# Patient Record
Sex: Female | Born: 1965
Health system: Southern US, Community
[De-identification: ages and names within clinical notes are randomized; demographics above are authoritative.]

## PROBLEM LIST (undated history)

## (undated) DIAGNOSIS — T7840XA Allergy, unspecified, initial encounter: Secondary | ICD-10-CM

## (undated) HISTORY — DX: Allergy, unspecified, initial encounter: T78.40XA

## (undated) HISTORY — PX: OTHER SURGICAL HISTORY: SHX169

---

## 1998-10-23 ENCOUNTER — Ambulatory Visit (HOSPITAL_COMMUNITY): Admission: RE | Admit: 1998-10-23 | Discharge: 1998-10-23 | Payer: Self-pay | Admitting: Obstetrics and Gynecology

## 1998-11-07 ENCOUNTER — Encounter: Admission: RE | Admit: 1998-11-07 | Discharge: 1999-02-05 | Payer: Self-pay | Admitting: Obstetrics and Gynecology

## 1999-01-09 ENCOUNTER — Inpatient Hospital Stay (HOSPITAL_COMMUNITY): Admission: AD | Admit: 1999-01-09 | Discharge: 1999-01-12 | Payer: Self-pay | Admitting: Obstetrics and Gynecology

## 1999-01-11 ENCOUNTER — Encounter (HOSPITAL_COMMUNITY): Admission: RE | Admit: 1999-01-11 | Discharge: 1999-04-11 | Payer: Self-pay | Admitting: Obstetrics and Gynecology

## 1999-05-27 ENCOUNTER — Other Ambulatory Visit: Admission: RE | Admit: 1999-05-27 | Discharge: 1999-05-27 | Payer: Self-pay | Admitting: Obstetrics and Gynecology

## 2000-06-07 ENCOUNTER — Other Ambulatory Visit: Admission: RE | Admit: 2000-06-07 | Discharge: 2000-06-07 | Payer: Self-pay | Admitting: Obstetrics and Gynecology

## 2001-06-02 ENCOUNTER — Other Ambulatory Visit: Admission: RE | Admit: 2001-06-02 | Discharge: 2001-06-02 | Payer: Self-pay | Admitting: Obstetrics and Gynecology

## 2001-08-01 ENCOUNTER — Ambulatory Visit (HOSPITAL_COMMUNITY): Admission: RE | Admit: 2001-08-01 | Discharge: 2001-08-01 | Payer: Self-pay | Admitting: Obstetrics and Gynecology

## 2001-11-03 ENCOUNTER — Ambulatory Visit (HOSPITAL_COMMUNITY): Admission: RE | Admit: 2001-11-03 | Discharge: 2001-11-03 | Payer: Self-pay | Admitting: Obstetrics and Gynecology

## 2002-01-05 ENCOUNTER — Encounter: Payer: Self-pay | Admitting: Obstetrics and Gynecology

## 2002-01-05 ENCOUNTER — Inpatient Hospital Stay (HOSPITAL_COMMUNITY): Admission: AD | Admit: 2002-01-05 | Discharge: 2002-01-09 | Payer: Self-pay | Admitting: Obstetrics and Gynecology

## 2002-01-10 ENCOUNTER — Encounter: Admission: RE | Admit: 2002-01-10 | Discharge: 2002-02-09 | Payer: Self-pay | Admitting: Obstetrics and Gynecology

## 2002-02-10 ENCOUNTER — Encounter: Admission: RE | Admit: 2002-02-10 | Discharge: 2002-03-12 | Payer: Self-pay | Admitting: Obstetrics and Gynecology

## 2002-02-13 ENCOUNTER — Other Ambulatory Visit: Admission: RE | Admit: 2002-02-13 | Discharge: 2002-02-13 | Payer: Self-pay | Admitting: Obstetrics and Gynecology

## 2002-10-18 HISTORY — PX: NASAL SINUS SURGERY: SHX719

## 2003-02-22 ENCOUNTER — Other Ambulatory Visit: Admission: RE | Admit: 2003-02-22 | Discharge: 2003-02-22 | Payer: Self-pay | Admitting: Obstetrics and Gynecology

## 2003-09-18 ENCOUNTER — Ambulatory Visit (HOSPITAL_COMMUNITY): Admission: RE | Admit: 2003-09-18 | Discharge: 2003-09-18 | Payer: Self-pay | Admitting: Internal Medicine

## 2004-03-20 ENCOUNTER — Other Ambulatory Visit: Admission: RE | Admit: 2004-03-20 | Discharge: 2004-03-20 | Payer: Self-pay | Admitting: Obstetrics and Gynecology

## 2004-12-25 ENCOUNTER — Encounter (INDEPENDENT_AMBULATORY_CARE_PROVIDER_SITE_OTHER): Payer: Self-pay | Admitting: *Deleted

## 2004-12-25 ENCOUNTER — Ambulatory Visit (HOSPITAL_COMMUNITY): Admission: RE | Admit: 2004-12-25 | Discharge: 2004-12-25 | Payer: Self-pay | Admitting: Obstetrics and Gynecology

## 2005-04-27 ENCOUNTER — Other Ambulatory Visit: Admission: RE | Admit: 2005-04-27 | Discharge: 2005-04-27 | Payer: Self-pay | Admitting: Obstetrics and Gynecology

## 2007-06-22 ENCOUNTER — Encounter: Admission: RE | Admit: 2007-06-22 | Discharge: 2007-06-22 | Payer: Self-pay | Admitting: Obstetrics and Gynecology

## 2010-01-16 ENCOUNTER — Encounter (INDEPENDENT_AMBULATORY_CARE_PROVIDER_SITE_OTHER): Payer: Self-pay | Admitting: Obstetrics and Gynecology

## 2010-01-16 ENCOUNTER — Ambulatory Visit (HOSPITAL_COMMUNITY): Admission: RE | Admit: 2010-01-16 | Discharge: 2010-01-17 | Payer: Self-pay | Admitting: Obstetrics and Gynecology

## 2010-04-10 ENCOUNTER — Encounter
Admission: RE | Admit: 2010-04-10 | Discharge: 2010-04-10 | Payer: Self-pay | Source: Home / Self Care | Admitting: Obstetrics and Gynecology

## 2010-10-06 ENCOUNTER — Ambulatory Visit (HOSPITAL_COMMUNITY)
Admission: RE | Admit: 2010-10-06 | Discharge: 2010-10-06 | Payer: Self-pay | Source: Home / Self Care | Attending: Family Medicine | Admitting: Family Medicine

## 2010-10-23 ENCOUNTER — Encounter (HOSPITAL_COMMUNITY)
Admission: RE | Admit: 2010-10-23 | Discharge: 2010-11-17 | Payer: Self-pay | Source: Home / Self Care | Attending: Neurology | Admitting: Neurology

## 2010-11-19 ENCOUNTER — Encounter (HOSPITAL_COMMUNITY)
Admission: RE | Admit: 2010-11-19 | Discharge: 2010-11-19 | Disposition: A | Discharge status: Home / Self Care | Payer: Self-pay | Source: Ambulatory Visit | Attending: Neurology | Admitting: Neurology

## 2010-11-19 DIAGNOSIS — M545 Low back pain, unspecified: Secondary | ICD-10-CM | POA: Insufficient documentation

## 2010-11-19 DIAGNOSIS — IMO0001 Reserved for inherently not codable concepts without codable children: Secondary | ICD-10-CM | POA: Insufficient documentation

## 2011-01-06 LAB — BASIC METABOLIC PANEL
BUN: 4 mg/dL — ABNORMAL LOW (ref 6–23)
CO2: 31 mEq/L (ref 19–32)
Calcium: 8.4 mg/dL (ref 8.4–10.5)
Chloride: 101 mEq/L (ref 96–112)
Creatinine, Ser: 0.67 mg/dL (ref 0.4–1.2)
GFR calc Af Amer: >60 mL/min (ref >60)
GFR calc non Af Amer: >60 mL/min (ref >60)
Glucose, Bld: 119 mg/dL — ABNORMAL HIGH (ref 70–99)
Potassium: 3.5 mEq/L (ref 3.5–5.1)
Sodium: 136 mEq/L (ref 135–145)

## 2011-01-06 LAB — CBC
HCT: 33.6 % — ABNORMAL LOW (ref 36.0–46.0)
Hemoglobin: 11.6 g/dL — ABNORMAL LOW (ref 12.0–15.0)
MCHC: 34.5 g/dL (ref 30.0–36.0)
MCV: 99.6 fL (ref 78.0–100.0)
Platelets: 202 10*3/uL (ref 150–400)
RBC: 3.37 MIL/uL — ABNORMAL LOW (ref 3.87–5.11)
RDW: 12.1 % (ref 11.5–15.5)
WBC: 9.3 10*3/uL (ref 4.0–10.5)

## 2011-01-10 LAB — BASIC METABOLIC PANEL
BUN: 12 mg/dL (ref 6–23)
Calcium: 8.9 mg/dL (ref 8.4–10.5)
Chloride: 104 mEq/L (ref 96–112)
Creatinine, Ser: 0.7 mg/dL (ref 0.4–1.2)
GFR calc Af Amer: >60 mL/min (ref >60)

## 2011-01-10 LAB — CBC
MCV: 99.2 fL (ref 78.0–100.0)
Platelets: 245 10*3/uL (ref 150–400)
RBC: 4.21 MIL/uL (ref 3.87–5.11)
WBC: 7.9 10*3/uL (ref 4.0–10.5)

## 2011-01-10 LAB — PREGNANCY, URINE: Preg Test, Ur: NEGATIVE

## 2011-03-05 NOTE — Op Note (Signed)
Darlene Galvan, Darlene Galvan                ACCOUNT NO.:  [id number]   MEDICAL RECORD NO.:  [id number]          PATIENT TYPE:  AMB   LOCATION:  SDC                           FACILITY:  [facility]   PHYSICIAN:  Michelle L. Grewal, M.D.DATE OF BIRTH:  [birth year]   DATE OF PROCEDURE:  12/25/2004  DATE OF DISCHARGE:                                 OPERATIVE REPORT   PREOPERATIVE DIAGNOSES:  1.  Retained intrauterine device.  2.  Endometritis.   POSTOPERATIVE DIAGNOSES:  1.  Retained intrauterine device.  2.  Endometritis.   PROCEDURES:  1.  Dilatation and curettage.  2.  Intrauterine device removal.   SURGEON:  Michelle L. Vincente Poli, M.D.   ASSISTANT:  Not applicable.   ANESTHESIA:  MAC with local.   SPECIMENS:  Uterine curettings and IUD.   ESTIMATED BLOOD LOSS:  Minimal.   COMPLICATIONS:  None.   PROCEDURE:  The patient was taken to the operating room and sedation was  performed.  She was placed in the lithotomy position.  She was prepped and  draped in the usual sterile fashion and an in-and-out catheter was used to  empty the bladder.  A speculum was inserted in the vagina.  The cervix was  grasped with a tenaculum, and a paracervical block was performed.  Using the  IUD hook, we inserted it into the uterus and removed the IUD, which was  intact.  It was noted the sling was slightly wrapped around the IUD.  At  this point I then inserted a sharp curette and curetted the uterus, and the  blood that came out was slightly watery and murky.  I suspect the patient  had endometritis.  At the end of the procedure, no bleeding was noted.  All  sponge, lap and instrument counts were correct x2.  She went to the recovery  room in stable condition.    MLG/MEDQ  D:  12/25/2004  T:  12/25/2004  Job:  [id number]

## 2011-03-05 NOTE — H&P (Signed)
NAME:  Darlene Galvan, Darlene Galvan NO.:  [id number]   MEDICAL RECORD NO.:  [id number]                  PATIENT TYPE:   LOCATION:                                       FACILITY:   PHYSICIAN:  Lionel December, M.D.                 DATE OF BIRTH:  03-24-1966   DATE OF ADMISSION:  DATE OF DISCHARGE:                                HISTORY & PHYSICAL   REFERRAL:  Self.   CHIEF COMPLAINT:  Hoarseness.   HISTORY OF PRESENT ILLNESS:  Darlene Galvan is a 45 year old Caucasian female who  presents today as a self-referral for further evaluation of persistent  chronic hoarseness.  She had sinus surgery in May 2004 by Dr. Annalee Genta for  chronic sinusitis and deviated septum.  She states a couple of months later  she started developing hoarseness.  She was evaluated by Dr. Annalee Genta again  and was told that her symptoms were due to gastroesophageal reflux disease.  She was started on Nexium once daily and took this for two months.  The  symptoms gradually worsened.  Then she was switched to Protonix 40 mg b.i.d.  which she has been on since that time.  The patient is not improving.  She  denies any typical heartburn symptoms now or even before being on PPI  therapy.  Denies any dysphagia, odynophagia.  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  Since being on the Protonix  she has noted some abdominal cramping and bloating and constipation.  Her  appetite is normal.  Bowels move every two days.  Denies any melena or  rectal bleeding.   CURRENT MEDICATIONS:  1. Protonix 40 mg b.i.d.  2. Tylenol Sinus p.r.n.  3. Aleve started this week for back pain - takes two daily p.r.n.  4. Excedrin p.r.n. headache - takes one dose every two to three weeks.  5. Allegra daily.  6. B12 daily.  7. Multivitamin daily.   ALLERGIES:  CODEINE and SULFA.   PAST MEDICAL HISTORY:  Early osteoarthritis.   PAST SURGICAL HISTORY:  Status post sinus surgery for chronic  sinusitis and  deviated septum by Dr. Annalee Genta Mar 18, 2003.  She had right knee surgery  in 1995.  She has had two cesarean sections.   FAMILY HISTORY:  Mother has hemophilia.  Father has heart problems and  hypertension.  No family history of colorectal cancer.   SOCIAL HISTORY:  She has been married for six years, has two children.  She  is employed at [employer].  She has never been a smoker.  She may consume a glass of wine on the weekend.   REVIEW OF SYSTEMS:  Please see HPI for GI.  CARDIOPULMONARY:  Denies any  shortness of breath.  GENERAL:  Denies any weight loss.   PHYSICAL EXAMINATION:  VITAL SIGNS:  Weight 131, height  5 feet 3.5 inches.  Temperature 96.8, blood pressure 110/70, pulse 68.  GENERAL:  Pleasant, well-nourished, well-developed Caucasian female in no  acute distress.  SKIN:  Warm and dry, no jaundice.  HEENT:  Pupils equal, round, and reactive to light.  Conjunctivae are pink,  sclerae nonicteric.  Oropharyngeal mucosa moist and pink; no lesions,  erythema, or exudate.  No lymphadenopathy or thyromegaly.  CHEST:  Lungs are clear to auscultation.  CARDIAC:  Reveals regular rate and rhythm, normal S1, S2.  No murmurs, rubs,  or gallops.  ABDOMEN:  Positive bowel sounds, soft, nontender, nondistended.  No  organomegaly or masses.  EXTREMITIES:  No edema.   IMPRESSION:  Chronic persistent hoarseness, possibly due to  laryngopharyngeal reflux.  She has now been on PPI therapy for at least four  months and notes no improvement of her symptoms.   PLAN:  1. Upper endoscopy in the near future.  She ultimately may need to have a 24-     hour pH probe study off of therapy.  2. She will stop Protonix, begin Aciphex 20 mg p.o. b.i.d. #30 provided.  3. Will try to obtain records from Dr. Annalee Genta.     _____________________________________  ___________________________________________  Tana Coast, P.A.                      Lionel December,  M.D.   LL/MEDQ  D:  09/06/2003  T:  09/06/2003  Job:  [id number]

## 2011-03-05 NOTE — Discharge Summary (Signed)
[facility]  Patient:    Darlene Galvan, Darlene Galvan Visit Number: [id number] MRN: [id number]          Service Type: OBS Location: 910A 9101 [id number] Attending Physician:  Soledad Gerlach Dictated by:   Juluis Mire, M.D. Admit Date:  01/05/2002 Discharge Date: 01/09/2002                             Discharge Summary  ADMITTING DIAGNOSES:          1. Intrauterine pregnancy at 36-5/7 weeks with                                  rupture of membranes.                               2. Prior cesarean section, desires a trial of                                  labor.  DISCHARGE DIAGNOSES:          1. Intrauterine pregnancy at 36-5/7 weeks with                                  rupture of membranes.                               2. Prior cesarean section, desires a trial of                                  labor.                               3. Subsequent failure to progress, leading to a                                  low transverse cesarean section.  HISTORY OF PRESENT ILLNESS:   For complete history and physical, please see written note.  HOSPITAL COURSE:              The patient was admitted on the 21st with spontaneous rupture of membranes.  Pooled amniotic fluid was sent with an L/S or 3:1 with positive PG.  She was begun on Pitocin induction.  Failed to progress and subsequently had a low transverse cesarean section on March 22 with delivery of a female infant, Apgars were 8/8.  The pH was 7.32.  Postop did well, postop hemoglobin 10.2, discharged home on her third postop day.  At that time she was afebrile with stable vital signs.  Fundus was firm and nontender.  The incision was intact.  Staples were being removed.  She had normal bowel and bladder function.  In terms of complications, none were encountered during her stay in the hospital.  The patient was discharged in stable condition.  DISPOSITION:                  Routine postop  instructions were given.  She is to avoid heavy lifting, vaginal entrance, or driving a car.  DISCHARGE MEDICATIONS:        Discharged home on Tylox as needed for pain and to continue prenatal vitamins.  FOLLOW-UP:                    She will follow up in the office in one week for incisional check.  SPECIAL INSTRUCTIONS:         She is to watch for signs of infection, heavy bleeding, or increasing discomfort. Dictated by:   Juluis Mire, M.D. Attending Physician:  Soledad Gerlach DD:  01/09/02 TD:  01/10/02 Job: [id number]

## 2012-04-27 ENCOUNTER — Other Ambulatory Visit: Payer: Self-pay | Admitting: Obstetrics and Gynecology

## 2013-03-28 ENCOUNTER — Ambulatory Visit (INDEPENDENT_AMBULATORY_CARE_PROVIDER_SITE_OTHER): Payer: BC Managed Care – PPO | Admitting: Family Medicine

## 2013-03-28 ENCOUNTER — Encounter: Payer: Self-pay | Admitting: Family Medicine

## 2013-03-28 VITALS — BP 122/70 | Temp 98.2°F | Wt 135.6 lb

## 2013-03-28 DIAGNOSIS — J019 Acute sinusitis, unspecified: Secondary | ICD-10-CM

## 2013-03-28 MED ORDER — LEVOFLOXACIN 500 MG PO TABS: 500.0000 mg | ORAL_TABLET | Freq: Every day | ORAL | Status: AC

## 2013-03-28 NOTE — Progress Notes (Signed)
  Subjective:    Patient ID: Darlene Galvan, female    DOB: Feb 01, 1966, 46 y.o.   MRN: [id number]  Sore Throat  This is a new problem. The current episode started in the past 7 days. Associated symptoms include congestion and ear pain.  Patient with head congestion drainage sinus pressure not feeling good sore throat denies high fever chills denies any wheezing. Past medical history sinusitis social doesn't smoke family history noncontributory    Review of Systems  HENT: Positive for ear pain and congestion.        Objective:   Physical Exam Eardrums normal throat normal neck is supple lungs clear heart regular no crackles not respiratory distress moderate sinus tenderness and pain       Assessment & Plan:  Acute sinusitis-Levaquin 10 days minimum patient has frequent history of this may need 14 days. She is to followup if progressive troubles warnings were discussed

## 2013-04-05 ENCOUNTER — Other Ambulatory Visit: Payer: Self-pay | Admitting: Family Medicine

## 2013-06-25 ENCOUNTER — Encounter: Payer: Self-pay | Admitting: Family Medicine

## 2013-06-25 ENCOUNTER — Ambulatory Visit (INDEPENDENT_AMBULATORY_CARE_PROVIDER_SITE_OTHER): Payer: BC Managed Care – PPO | Admitting: Family Medicine

## 2013-06-25 VITALS — BP 106/68 | Ht 63.0 in | Wt 140.6 lb

## 2013-06-25 DIAGNOSIS — M79605 Pain in left leg: Secondary | ICD-10-CM | POA: Insufficient documentation

## 2013-06-25 DIAGNOSIS — M545 Low back pain, unspecified: Secondary | ICD-10-CM

## 2013-06-25 MED ORDER — CYCLOBENZAPRINE HCL 10 MG PO TABS: 10.0000 mg | ORAL_TABLET | Freq: Three times a day (TID) | ORAL | Status: DC | PRN

## 2013-06-25 MED ORDER — KETOCONAZOLE 2 % EX CREA: TOPICAL_CREAM | Freq: Two times a day (BID) | CUTANEOUS | Status: DC

## 2013-06-25 MED ORDER — PREDNISONE 20 MG PO TABS: ORAL_TABLET | ORAL | Status: DC

## 2013-06-25 MED ORDER — OXYCODONE-ACETAMINOPHEN 5-325 MG PO TABS: 1.0000 | ORAL_TABLET | ORAL | Status: DC | PRN

## 2013-06-25 NOTE — Progress Notes (Signed)
  Subjective:    Patient ID: Darlene Galvan, female    DOB: 17-Jul-1966, 47 y.o.   MRN: [id number]  Back Pain This is a new problem. The current episode started in the past 7 days. The problem occurs constantly. The problem has been rapidly worsening since onset. The pain is present in the lumbar spine. The quality of the pain is described as burning. The pain radiates to the left thigh, left knee and left foot. The pain is at a severity of 8/10. The pain is moderate. The pain is the same all the time. Stiffness is present all day.   Patient arrives with severe back pain -started Friday and is getting worse. Pain is shooting down left leg. She's had a sacroiliac strain in the past.  Review of Systems  Musculoskeletal: Positive for back pain.       Objective:   Physical Exam  Lower back moderate tenderness in the lower left leg negative straight leg raise. Increased pain with walking. Numbness on the lateral portion of the leg. No foot drop. Strength overall decent.      Assessment & Plan:  FMLA Percocet/ flexiril/ prednisone 10 day followup Patient was told that if the pain becomes worse or wakes her up at night or weakness or loss of bowel or bladder control immediately notify us may need MRI may need neurosurgeon opinion followup 10 days

## 2013-06-30 ENCOUNTER — Encounter: Payer: Self-pay | Admitting: *Deleted

## 2013-07-05 ENCOUNTER — Ambulatory Visit: Payer: BC Managed Care – PPO | Admitting: Family Medicine

## 2013-08-03 ENCOUNTER — Telehealth: Payer: Self-pay

## 2013-08-03 ENCOUNTER — Other Ambulatory Visit: Payer: Self-pay | Admitting: *Deleted

## 2013-08-03 MED ORDER — MELOXICAM 15 MG PO TABS: 15.0000 mg | ORAL_TABLET | Freq: Every day | ORAL | Status: DC

## 2013-08-03 NOTE — Telephone Encounter (Signed)
Please inform the patient that I received a letter from Dr. Magnus Sinning a chiropractor. He had requested that we prescribe the medication for pain and inflammation. I would recommend Mobic 15 mg 1 daily #30 with 2 refills. This is a strong anti-inflammatory that should help with pain and lower back inflammation. If it bothers her stomach she should stop it. Do not take ibuprofen or Aleve this with this. If she has further troubles please followup with [place].

## 2013-08-03 NOTE — Telephone Encounter (Signed)
See letter on chart

## 2013-08-03 NOTE — Telephone Encounter (Signed)
Discussed with pt. Med sent to [facility].

## 2013-09-04 ENCOUNTER — Other Ambulatory Visit: Payer: Self-pay | Admitting: Family Medicine

## 2013-09-19 ENCOUNTER — Other Ambulatory Visit: Payer: Self-pay | Admitting: Obstetrics and Gynecology

## 2013-12-03 ENCOUNTER — Other Ambulatory Visit: Payer: Self-pay | Admitting: Family Medicine

## 2013-12-19 ENCOUNTER — Encounter: Payer: Self-pay | Admitting: Family Medicine

## 2013-12-19 ENCOUNTER — Ambulatory Visit (INDEPENDENT_AMBULATORY_CARE_PROVIDER_SITE_OTHER): Payer: BC Managed Care – PPO | Admitting: Family Medicine

## 2013-12-19 VITALS — BP 114/68 | Temp 99.1°F | Ht 63.0 in | Wt 146.0 lb

## 2013-12-19 DIAGNOSIS — J322 Chronic ethmoidal sinusitis: Secondary | ICD-10-CM

## 2013-12-19 MED ORDER — METHYLPREDNISOLONE ACETATE 40 MG/ML IJ SUSP
40.0000 mg | Freq: Once | INTRAMUSCULAR | Status: AC
  Administered 2013-12-19: 40 mg via INTRAMUSCULAR

## 2013-12-19 MED ORDER — LEVOFLOXACIN 500 MG PO TABS: 500.0000 mg | ORAL_TABLET | Freq: Every day | ORAL | Status: DC

## 2013-12-19 NOTE — Progress Notes (Signed)
   Subjective:    Patient ID: Darlene Galvan, female    DOB: 11/12/1965, 48 y.o.   MRN: [id number]  Fever  This is a new problem. The current episode started 1 to 4 weeks ago. Associated symptoms include coughing and headaches. Associated symptoms comments: Runny nose. Treatments tried: augmentin. The treatment provided no relief.   Frontal headache  Sig congestion   Cough with occas production  dimihed energya and low gr temp  achey and cough.   Review of Systems  Constitutional: Positive for fever.  Respiratory: Positive for cough.   Neurological: Positive for headaches.   no vomiting no diarrhea no rash     Objective:   Physical Exam Alert moderate malaise. HEENT moderate nasal congestion pharynx normal. Neck supple. Frontal tenderness. Lungs clear. Heart regular in rhythm.       Assessment & Plan:  Impression 1 acute rhinosinusitis discussed plan antibiotics prescribed. Symptomatic care discussed. Warning signs discussed. WSL

## 2014-06-13 ENCOUNTER — Encounter: Payer: Self-pay | Admitting: Nurse Practitioner

## 2014-06-13 ENCOUNTER — Ambulatory Visit (INDEPENDENT_AMBULATORY_CARE_PROVIDER_SITE_OTHER): Payer: BC Managed Care – PPO | Admitting: Nurse Practitioner

## 2014-06-13 VITALS — BP 112/70 | Ht 63.0 in | Wt 145.0 lb

## 2014-06-13 DIAGNOSIS — J329 Chronic sinusitis, unspecified: Secondary | ICD-10-CM

## 2014-06-13 DIAGNOSIS — J310 Chronic rhinitis: Secondary | ICD-10-CM

## 2014-06-13 MED ORDER — METHYLPREDNISOLONE ACETATE 40 MG/ML IJ SUSP
40.0000 mg | Freq: Once | INTRAMUSCULAR | Status: AC
  Administered 2014-06-13: 40 mg via INTRAMUSCULAR

## 2014-06-13 MED ORDER — LEVOFLOXACIN 500 MG PO TABS: 500.0000 mg | ORAL_TABLET | Freq: Every day | ORAL | Status: DC

## 2014-06-19 ENCOUNTER — Encounter: Payer: Self-pay | Admitting: Nurse Practitioner

## 2014-06-19 NOTE — Progress Notes (Signed)
Subjective:  Presents with a sinus symptoms for the past 4 days, worse yesterday. Facial area headache. Occasional cough. Producing green mucus. Postnasal drainage. Sore throat. Ear pressure. Hoarseness. No fever. No wheezing. No relief with OTC meds.  Objective:   BP 112/70  Ht  (1.6 m)  Wt 145 lb (65.772 kg)  BMI 25.69 kg/m2 NAD. Alert, oriented. TMs clear effusion, no erythema. Pharynx injected with green PND noted. Neck supple with mild soft anterior adenopathy. Lungs clear. Heart regular rate rhythm.  Assessment: Rhinosinusitis - Plan: methylPREDNISolone acetate (DEPO-MEDROL) injection 40 mg  Plan:  Meds ordered this encounter  Medications  . levofloxacin (LEVAQUIN) 500 MG tablet    Sig: Take 1 tablet (500 mg total) by mouth daily.    Dispense:  10 tablet    Refill:  0    Order Specific Question:  Supervising Provider    Answer:  Merlyn Albert [[id number]]  . methylPREDNISolone acetate (DEPO-MEDROL) injection 40 mg    Sig:    OTC meds as directed for congestion and cough. Restart Flonase as directed. Callback in 7-10 days if no improvement, sooner if worse.

## 2014-06-20 ENCOUNTER — Other Ambulatory Visit: Payer: Self-pay | Admitting: Family Medicine

## 2014-06-25 ENCOUNTER — Other Ambulatory Visit: Payer: Self-pay | Admitting: Nurse Practitioner

## 2014-07-03 ENCOUNTER — Telehealth: Payer: Self-pay | Admitting: Family Medicine

## 2014-07-03 MED ORDER — LEVOFLOXACIN 500 MG PO TABS: 500.0000 mg | ORAL_TABLET | Freq: Every day | ORAL | Status: DC

## 2014-07-03 NOTE — Telephone Encounter (Signed)
It would be fine to go ahead and refill the Levaquin. Followup of ongoing troubles.

## 2014-07-03 NOTE — Telephone Encounter (Signed)
Rx sent electronically to pharmacy. Patient notified. 

## 2014-07-03 NOTE — Telephone Encounter (Signed)
Pt seen for sinus infection 06/13/14, given levaquin Is still not any better, lots of pressure in her frontal  Lobe, pressure on her teeth, etc   Wants to know if we can refill this med for her?   She got a little better but it came back after finishing the med    [address]

## 2014-10-14 ENCOUNTER — Ambulatory Visit (INDEPENDENT_AMBULATORY_CARE_PROVIDER_SITE_OTHER): Payer: BC Managed Care – PPO | Admitting: Family Medicine

## 2014-10-14 ENCOUNTER — Encounter: Payer: Self-pay | Admitting: Family Medicine

## 2014-10-14 VITALS — BP 102/64 | Temp 98.1°F | Ht 63.0 in | Wt 140.0 lb

## 2014-10-14 DIAGNOSIS — J301 Allergic rhinitis due to pollen: Secondary | ICD-10-CM

## 2014-10-14 DIAGNOSIS — J0100 Acute maxillary sinusitis, unspecified: Secondary | ICD-10-CM

## 2014-10-14 MED ORDER — LEVOFLOXACIN 500 MG PO TABS: 500.0000 mg | ORAL_TABLET | Freq: Every day | ORAL | Status: DC

## 2014-10-14 MED ORDER — METHYLPREDNISOLONE ACETATE 40 MG/ML IJ SUSP
40.0000 mg | Freq: Once | INTRAMUSCULAR | Status: AC
  Administered 2014-10-14: 40 mg via INTRAMUSCULAR

## 2014-10-14 NOTE — Progress Notes (Signed)
   Subjective:    Patient ID: Darlene Galvan, female    DOB: 05/09/1966, 48 y.o.   MRN: [id number]  Sinusitis This is a new problem. The current episode started in the past 7 days. Associated symptoms include congestion, coughing, headaches, a hoarse voice, sinus pressure and a sore throat. Pertinent negatives include no ear pain or shortness of breath. Treatments tried: triaminic, mucinex, tylenol sinus.   Has had significant allergy issues sneezing congestion over the past few weeks. Requests steroid shot. PMH benign  Review of Systems  Constitutional: Negative for fever and activity change.  HENT: Positive for congestion, hoarse voice, rhinorrhea, sinus pressure and sore throat. Negative for ear pain.   Eyes: Negative for discharge.  Respiratory: Positive for cough. Negative for shortness of breath and wheezing.   Cardiovascular: Negative for chest pain.  Neurological: Positive for headaches.       Objective:   Physical Exam  Constitutional: She appears well-developed.  HENT:  Head: Normocephalic.  Nose: Nose normal.  Mouth/Throat: Oropharynx is clear and moist. No oropharyngeal exudate.  Neck: Neck supple.  Cardiovascular: Normal rate and normal heart sounds.   No murmur heard. Pulmonary/Chest: Effort normal and breath sounds normal. She has no wheezes.  Lymphadenopathy:    She has no cervical adenopathy.  Skin: Skin is warm and dry.  Nursing note and vitals reviewed.         Assessment & Plan:  Viral syndrome with secondary sinusitis antibiotics prescribed warning signs discussed should gradually get better.

## 2014-11-04 ENCOUNTER — Other Ambulatory Visit: Payer: Self-pay | Admitting: Family Medicine

## 2014-11-14 ENCOUNTER — Other Ambulatory Visit: Payer: Self-pay | Admitting: Obstetrics and Gynecology

## 2014-11-15 LAB — CYTOLOGY - PAP

## 2014-11-22 ENCOUNTER — Encounter: Payer: Self-pay | Admitting: Family Medicine

## 2014-11-22 ENCOUNTER — Ambulatory Visit (INDEPENDENT_AMBULATORY_CARE_PROVIDER_SITE_OTHER): Payer: BC Managed Care – PPO | Admitting: Family Medicine

## 2014-11-22 VITALS — BP 110/80 | Temp 98.4°F | Ht 63.0 in | Wt 143.2 lb

## 2014-11-22 DIAGNOSIS — J0100 Acute maxillary sinusitis, unspecified: Secondary | ICD-10-CM

## 2014-11-22 MED ORDER — LEVOFLOXACIN 500 MG PO TABS: 500.0000 mg | ORAL_TABLET | Freq: Every day | ORAL | Status: DC

## 2014-11-22 NOTE — Progress Notes (Signed)
   Subjective:    Patient ID: Darlene Galvan, female    DOB: 08/25/1966, 49 y.o.   MRN: [id number]  Sinusitis This is a new problem. The current episode started in the past 7 days. The problem is unchanged. Associated symptoms include congestion, ear pain, headaches and sinus pressure. Past treatments include oral decongestants. The treatment provided no relief.   Patient states that she has no other concerns at this time.    Review of Systems  HENT: Positive for congestion, ear pain and sinus pressure.   Neurological: Positive for headaches.   Head congestion drainage coughing sinus pressure not feeling good denies high fever    Objective:   Physical Exam Moderate sinus tenderness frontal and maxillary eardrums normal throat is normal neck supple lungs clear heart regular patient not toxic       Assessment & Plan:  Sinusitis antibodies prescribe warning signs discussed follow-up if problems  If frequent problems with sinuses consider ENT referral patient was talked to about that she will let us know

## 2014-12-03 ENCOUNTER — Telehealth: Payer: Self-pay | Admitting: Family Medicine

## 2014-12-03 ENCOUNTER — Other Ambulatory Visit: Payer: Self-pay | Admitting: *Deleted

## 2014-12-03 MED ORDER — LEVOFLOXACIN 500 MG PO TABS: 500.0000 mg | ORAL_TABLET | Freq: Every day | ORAL | Status: DC

## 2014-12-03 NOTE — Telephone Encounter (Signed)
Patient was seen 2/5 with sinus infection and finished up antibotics Sunday but sinues are still infected and heavy drainage. She wants another around of antibotics called into [facility].

## 2014-12-03 NOTE — Telephone Encounter (Signed)
Same symptoms as below. Pt wants refill on levaquin. Med sent to pharm. Pt notified.

## 2014-12-03 NOTE — Telephone Encounter (Signed)
NTC- verify with pt whats going on ,if wanting rfill send in,Send in refill levaquin, (follow up if ongoing,alert me if other details)

## 2014-12-03 NOTE — Telephone Encounter (Addendum)
Pt was seen on 02/05 for sinusitis. Prescribed levaquin. She finished Sunday and still having same s/s. Sinus pressure and sinus HA. No fever.  Would like another round called in.

## 2015-04-02 ENCOUNTER — Telehealth: Payer: Self-pay | Admitting: Family Medicine

## 2015-04-02 NOTE — Telephone Encounter (Signed)
Pt is wanting levaquin sent in pt states Darlene Galvan and [name] know her history of sinus infections. Pt is going out of town this morning and wants it to be called in to [facility].

## 2015-04-02 NOTE — Telephone Encounter (Signed)
[facility] ( was last seen 4 months ago and we don't call in antibiotics unless patient has recently been seen) Needs office visit

## 2015-04-02 NOTE — Telephone Encounter (Signed)
Patient was notified she needs an office visit. Patient states she will come in next week because she is leaving in 30 minutes to go out of town.

## 2015-06-10 ENCOUNTER — Other Ambulatory Visit: Payer: Self-pay | Admitting: Family Medicine

## 2015-09-02 ENCOUNTER — Other Ambulatory Visit: Payer: Self-pay | Admitting: Family Medicine

## 2015-09-24 ENCOUNTER — Ambulatory Visit (INDEPENDENT_AMBULATORY_CARE_PROVIDER_SITE_OTHER): Payer: BC Managed Care – PPO | Admitting: Nurse Practitioner

## 2015-09-24 ENCOUNTER — Encounter: Payer: Self-pay | Admitting: Nurse Practitioner

## 2015-09-24 VITALS — BP 110/72 | Temp 98.4°F | Ht 63.0 in | Wt 125.6 lb

## 2015-09-24 DIAGNOSIS — J329 Chronic sinusitis, unspecified: Secondary | ICD-10-CM | POA: Diagnosis not present

## 2015-09-24 DIAGNOSIS — S39012A Strain of muscle, fascia and tendon of lower back, initial encounter: Secondary | ICD-10-CM

## 2015-09-24 DIAGNOSIS — J310 Chronic rhinitis: Secondary | ICD-10-CM

## 2015-09-24 MED ORDER — AMOXICILLIN-POT CLAVULANATE 875-125 MG PO TABS: 1.0000 | ORAL_TABLET | Freq: Two times a day (BID) | ORAL | Status: DC

## 2015-09-24 MED ORDER — HYDROCODONE-ACETAMINOPHEN 5-325 MG PO TABS: 1.0000 | ORAL_TABLET | ORAL | Status: DC | PRN

## 2015-09-24 MED ORDER — CHLORZOXAZONE 500 MG PO TABS: 500.0000 mg | ORAL_TABLET | Freq: Three times a day (TID) | ORAL | Status: DC | PRN

## 2015-09-24 MED ORDER — MELOXICAM 15 MG PO TABS: 15.0000 mg | ORAL_TABLET | Freq: Every day | ORAL | Status: DC

## 2015-09-24 NOTE — Progress Notes (Signed)
Subjective:  Presents for c/o sinus symptoms for 10 days, worse x 3 d. No fever. Facial area headache. Green mucus. Sore throat. Ear pressure. occas cough. PND. No wheezing. Takes Flonase everyday. Also c/o generalized low back pain that began 4 days ago after lifting heavy containers with Christmas decorations. Localized, does not radiate. Works with prolonged standing, sitting or bending.   Objective:   BP 110/72 mmHg  Temp(Src) 98.4 F (36.9 C) (Oral)  Ht 5\' 3"  (1.6 m)  Wt 125 lb 9.6 oz (56.972 kg)  BMI 22.25 kg/m2 NAD. Alert, oriented. TMs clear effusion. Pharynx mild erythema with PND noted. Neck supple with mild anterior adenopathy. Lungs clear. Heart RRR. Mild generalized tenderness in bilat lumbar area particularly near SI joint.   Assessment: Rhinosinusitis  Low back strain, initial encounter  Plan:  Meds ordered this encounter  Medications  . HYDROcodone-acetaminophen (NORCO/VICODIN) 5-325 MG tablet    Sig: Take 1 tablet by mouth every 4 (four) hours as needed.    Dispense:  20 tablet    Refill:  0    Order Specific Question:  Supervising Provider    Answer:  Merlyn AlbertLUKING, WILLIAM S [[id number]]  . amoxicillin-clavulanate (AUGMENTIN) 875-125 MG tablet    Sig: Take 1 tablet by mouth 2 (two) times daily.    Dispense:  20 tablet    Refill:  0    Order Specific Question:  Supervising Provider    Answer:  Merlyn AlbertLUKING, WILLIAM S [[id number]]  . chlorzoxazone (PARAFON) 500 MG tablet    Sig: Take 1 tablet (500 mg total) by mouth 3 (three) times daily as needed for muscle spasms.    Dispense:  30 tablet    Refill:  0    Order Specific Question:  Supervising Provider    Answer:  Merlyn AlbertLUKING, WILLIAM S [[id number]]  . meloxicam (MOBIC) 15 MG tablet    Sig: Take 1 tablet (15 mg total) by mouth daily.    Dispense:  30 tablet    Refill:  0    Order Specific Question:  Supervising Provider    Answer:  Merlyn AlbertLUKING, WILLIAM S [[id number]]   Restart TENS unit. Given copy of back exercises. Ice/heat applications as directed.  Continue Flonase as directed. Call back if either problem worsens or persists.

## 2015-09-24 NOTE — Patient Instructions (Addendum)
Icy Hot Smart Relief TENS unit 

## 2015-10-06 ENCOUNTER — Other Ambulatory Visit: Payer: Self-pay | Admitting: Nurse Practitioner

## 2015-10-06 ENCOUNTER — Telehealth: Payer: Self-pay | Admitting: Family Medicine

## 2015-10-06 MED ORDER — LEVOFLOXACIN 500 MG PO TABS: 500.0000 mg | ORAL_TABLET | Freq: Every day | ORAL | Status: DC

## 2015-10-06 NOTE — Telephone Encounter (Signed)
Will send in 

## 2015-10-06 NOTE — Telephone Encounter (Signed)
Pt called stating that she finished the Augmentin and is not any better. Pt is requesting Levaquin to be called in.    [facility]

## 2015-10-06 NOTE — Telephone Encounter (Signed)
Patient notified

## 2015-10-17 ENCOUNTER — Other Ambulatory Visit: Payer: Self-pay | Admitting: Nurse Practitioner

## 2015-10-20 ENCOUNTER — Other Ambulatory Visit: Payer: Self-pay | Admitting: Family Medicine

## 2016-04-15 ENCOUNTER — Other Ambulatory Visit: Payer: Self-pay | Admitting: Nurse Practitioner

## 2016-04-15 ENCOUNTER — Telehealth: Payer: Self-pay | Admitting: Family Medicine

## 2016-04-15 MED ORDER — MELOXICAM 15 MG PO TABS: 15.0000 mg | ORAL_TABLET | Freq: Every day | ORAL | Status: DC

## 2016-04-15 NOTE — Telephone Encounter (Signed)
done

## 2016-04-15 NOTE — Telephone Encounter (Signed)
Pt is requesting mobic to be called in. Pt states that her chiropractor stated that he was going to send over a request for us to send it in for her yesterday. Please advise.      [facility]

## 2016-04-15 NOTE — Telephone Encounter (Signed)
Patient states she needs this for her low back pain. Patient states that she was last seen by Eber Jonesarolyn in Dec 2016 for this issue.

## 2016-04-16 NOTE — Telephone Encounter (Signed)
Left message informing patient that requested medication was sent in.

## 2016-09-06 ENCOUNTER — Encounter: Payer: Self-pay | Admitting: Nurse Practitioner

## 2016-09-06 ENCOUNTER — Ambulatory Visit (INDEPENDENT_AMBULATORY_CARE_PROVIDER_SITE_OTHER): Payer: BC Managed Care – PPO | Admitting: Nurse Practitioner

## 2016-09-06 VITALS — BP 102/64 | Temp 97.9°F | Ht 63.0 in | Wt 129.1 lb

## 2016-09-06 DIAGNOSIS — J019 Acute sinusitis, unspecified: Secondary | ICD-10-CM | POA: Diagnosis not present

## 2016-09-06 MED ORDER — FLUTICASONE PROPIONATE 50 MCG/ACT NA SUSP: 2.0000 | Freq: Every day | NASAL | 5 refills | Status: DC

## 2016-09-06 MED ORDER — LEVOFLOXACIN 500 MG PO TABS: 500.0000 mg | ORAL_TABLET | Freq: Every day | ORAL | 0 refills | Status: DC

## 2016-09-08 ENCOUNTER — Encounter: Payer: Self-pay | Admitting: Nurse Practitioner

## 2016-09-08 NOTE — Progress Notes (Signed)
Subjective:  Presents for complaints of a sinus infection over the past week. Fever has resolved. Sore throat. Facial area headache. Runny nose. Cough worse at nighttime. Producing yellow mucus. Right ear pain. No wheezing.  Objective:   BP 102/64   Temp 97.9 F (36.6 C) (Oral)   Ht 5\' 3"  (1.6 m)   Wt 129 lb 2 oz (58.6 kg)   BMI 22.87 kg/m  NAD. Alert, oriented. TMs retracted, no erythema. Pharynx injected with PND noted. Neck supple with mild soft anterior adenopathy. Lungs clear. Heart regular rate rhythm.  Assessment: Acute rhinosinusitis  Plan:  Meds ordered this encounter  Medications  . levofloxacin (LEVAQUIN) 500 MG tablet    Sig: Take 1 tablet (500 mg total) by mouth daily.    Dispense:  10 tablet    Refill:  0    Order Specific Question:   Supervising Provider    Answer:   Merlyn AlbertLUKING, WILLIAM S [[id number]]  . fluticasone (FLONASE) 50 MCG/ACT nasal spray    Sig: Place 2 sprays into both nostrils daily.    Dispense:  16 g    Refill:  5    Order Specific Question:   Supervising Provider    Answer:   Merlyn AlbertLUKING, WILLIAM S [[id number]]   Restart Flonase nasal spray and OTC antihistamine. Callback in 7-10 days if no improvement, sooner if worse.

## 2016-09-24 ENCOUNTER — Other Ambulatory Visit: Payer: Self-pay | Admitting: *Deleted

## 2016-09-24 ENCOUNTER — Telehealth: Payer: Self-pay | Admitting: Nurse Practitioner

## 2016-09-24 MED ORDER — AMOXICILLIN-POT CLAVULANATE 875-125 MG PO TABS: 1.0000 | ORAL_TABLET | Freq: Two times a day (BID) | ORAL | 0 refills | Status: DC

## 2016-09-24 NOTE — Telephone Encounter (Signed)
I would recommend a second round of antibiotics, Augmentin 875 mg 1 twice a day 10 days take with a snack it is also okay to use saline nasal spray and use a humidifier

## 2016-09-24 NOTE — Telephone Encounter (Signed)
Spoke with patient and patient stated that she is still experiencing the sinus headache and pressure, cough, sinus drainage and ear pain and pressure. States felt better on antibiotic but after the course was completed symptoms came back. Please advise?

## 2016-09-24 NOTE — Telephone Encounter (Signed)
Discussed with pt. Med sent to pharm.  

## 2016-09-24 NOTE — Telephone Encounter (Signed)
Pt called back stating that she has finished the antibiotic and the symptoms has came back. Pt is wanting to know if something else can be called in.      [facility]

## 2016-10-15 ENCOUNTER — Other Ambulatory Visit: Payer: Self-pay | Admitting: Nurse Practitioner

## 2016-12-06 DIAGNOSIS — M5442 Lumbago with sciatica, left side: Secondary | ICD-10-CM | POA: Diagnosis not present

## 2016-12-06 DIAGNOSIS — M47812 Spondylosis without myelopathy or radiculopathy, cervical region: Secondary | ICD-10-CM | POA: Diagnosis not present

## 2016-12-06 DIAGNOSIS — M546 Pain in thoracic spine: Secondary | ICD-10-CM | POA: Diagnosis not present

## 2017-02-14 DIAGNOSIS — Z1231 Encounter for screening mammogram for malignant neoplasm of breast: Secondary | ICD-10-CM | POA: Diagnosis not present

## 2017-02-14 DIAGNOSIS — Z6823 Body mass index (BMI) 23.0-23.9, adult: Secondary | ICD-10-CM | POA: Diagnosis not present

## 2017-02-14 DIAGNOSIS — Z01419 Encounter for gynecological examination (general) (routine) without abnormal findings: Secondary | ICD-10-CM | POA: Diagnosis not present

## 2017-05-11 DIAGNOSIS — M47812 Spondylosis without myelopathy or radiculopathy, cervical region: Secondary | ICD-10-CM | POA: Diagnosis not present

## 2017-05-11 DIAGNOSIS — M546 Pain in thoracic spine: Secondary | ICD-10-CM | POA: Diagnosis not present

## 2017-05-11 DIAGNOSIS — M5442 Lumbago with sciatica, left side: Secondary | ICD-10-CM | POA: Diagnosis not present

## 2017-10-31 ENCOUNTER — Encounter: Payer: Self-pay | Admitting: Family Medicine

## 2017-10-31 ENCOUNTER — Ambulatory Visit: Payer: BC Managed Care – PPO | Admitting: Family Medicine

## 2017-10-31 VITALS — BP 122/70 | Temp 99.2°F | Ht 63.0 in | Wt 134.0 lb

## 2017-10-31 DIAGNOSIS — J019 Acute sinusitis, unspecified: Secondary | ICD-10-CM | POA: Diagnosis not present

## 2017-10-31 MED ORDER — AMOXICILLIN-POT CLAVULANATE 875-125 MG PO TABS: 1.0000 | ORAL_TABLET | Freq: Two times a day (BID) | ORAL | 0 refills | Status: DC

## 2017-10-31 NOTE — Progress Notes (Signed)
   Subjective:    Patient ID: Darlene Galvan, female    DOB: 09/24/1966, 52 y.o.   MRN: [id number]  Sinusitis  This is a new problem. Episode onset: 5 days. Associated symptoms include congestion, coughing, ear pain, headaches and a sore throat. Pertinent negatives include no shortness of breath. (Wheezing) Treatments tried: advil, sudafed, tylenol sinus.   Patient started off head congestion drainage coughing now with sinus pressure pain discomfort no wheezing but some chest congestion   Review of Systems  Constitutional: Negative for activity change and fever.  HENT: Positive for congestion, ear pain, rhinorrhea and sore throat.   Eyes: Negative for discharge.  Respiratory: Positive for cough. Negative for shortness of breath and wheezing.   Cardiovascular: Negative for chest pain.  Neurological: Positive for headaches.       Objective:   Physical Exam  Constitutional: She appears well-developed.  HENT:  Head: Normocephalic.  Right Ear: External ear normal.  Left Ear: External ear normal.  Nose: Nose normal.  Mouth/Throat: Oropharynx is clear and moist. No oropharyngeal exudate.  Eyes: Right eye exhibits no discharge. Left eye exhibits no discharge.  Neck: Neck supple. No tracheal deviation present.  Cardiovascular: Normal rate and normal heart sounds.  No murmur heard. Pulmonary/Chest: Effort normal and breath sounds normal. She has no wheezes. She has no rales.  Lymphadenopathy:    She has no cervical adenopathy.  Skin: Skin is warm and dry.  Nursing note and vitals reviewed.         Assessment & Plan:  Patient was seen today for upper respiratory illness. It is felt that the patient is dealing with sinusitis. Antibiotics were prescribed today. Importance of compliance with medication was discussed. Symptoms should gradually resolve over the course of the next several days. If high fevers, progressive illness, difficulty breathing, worsening condition or failure for  symptoms to improve over the next several days then the patient is to follow-up. If any emergent conditions the patient is to follow-up in the emergency department otherwise to follow-up in the office.  Patient does not appear toxic should get better on antibiotics warning signs were discussed in detail

## 2017-11-03 DIAGNOSIS — M47812 Spondylosis without myelopathy or radiculopathy, cervical region: Secondary | ICD-10-CM | POA: Diagnosis not present

## 2017-11-03 DIAGNOSIS — M5442 Lumbago with sciatica, left side: Secondary | ICD-10-CM | POA: Diagnosis not present

## 2017-11-03 DIAGNOSIS — M546 Pain in thoracic spine: Secondary | ICD-10-CM | POA: Diagnosis not present

## 2017-11-09 DIAGNOSIS — M47812 Spondylosis without myelopathy or radiculopathy, cervical region: Secondary | ICD-10-CM | POA: Diagnosis not present

## 2017-11-09 DIAGNOSIS — M5442 Lumbago with sciatica, left side: Secondary | ICD-10-CM | POA: Diagnosis not present

## 2017-11-09 DIAGNOSIS — M546 Pain in thoracic spine: Secondary | ICD-10-CM | POA: Diagnosis not present

## 2017-11-16 DIAGNOSIS — M5442 Lumbago with sciatica, left side: Secondary | ICD-10-CM | POA: Diagnosis not present

## 2017-11-16 DIAGNOSIS — M546 Pain in thoracic spine: Secondary | ICD-10-CM | POA: Diagnosis not present

## 2017-11-16 DIAGNOSIS — M47812 Spondylosis without myelopathy or radiculopathy, cervical region: Secondary | ICD-10-CM | POA: Diagnosis not present

## 2018-01-10 ENCOUNTER — Encounter: Payer: Self-pay | Admitting: Nurse Practitioner

## 2018-01-10 ENCOUNTER — Ambulatory Visit: Payer: BC Managed Care – PPO | Admitting: Nurse Practitioner

## 2018-01-10 VITALS — BP 122/70 | Temp 97.5°F | Ht 63.0 in | Wt 133.4 lb

## 2018-01-10 DIAGNOSIS — J029 Acute pharyngitis, unspecified: Secondary | ICD-10-CM

## 2018-01-10 DIAGNOSIS — B9689 Other specified bacterial agents as the cause of diseases classified elsewhere: Secondary | ICD-10-CM

## 2018-01-10 DIAGNOSIS — J019 Acute sinusitis, unspecified: Secondary | ICD-10-CM | POA: Diagnosis not present

## 2018-01-10 LAB — POCT RAPID STREP A (OFFICE): Rapid Strep A Screen: NEGATIVE

## 2018-01-10 MED ORDER — AMOXICILLIN-POT CLAVULANATE 875-125 MG PO TABS: 1.0000 | ORAL_TABLET | Freq: Two times a day (BID) | ORAL | 0 refills | Status: DC

## 2018-01-10 NOTE — Patient Instructions (Addendum)
nasacort AQ as directed Allegra 180 mg once a day Hydrogen peroxide mixed warm water 1:1

## 2018-01-10 NOTE — Progress Notes (Signed)
Subjective: Presents for complaints of sinus symptoms over the past week.  No fever.  Sore throat.  Facial area headache.  Producing yellow mucus at times.  Right ear pain.  No wheezing.  Swollen tender glands.  Objective:   BP 122/70   Temp (!) 97.5 F (36.4 C) (Oral)   Ht 5\' 3"  (1.6 m)   Wt 133 lb 6.4 oz (60.5 kg)   BMI 23.63 kg/m  NAD.  Alert, oriented.  Right TMs retracted, no erythema.  Left TM mostly obscured with light dry cerumen.  Pharynx mild erythema with PND noted.  Neck supple with mild soft anterior adenopathy, tender to palpation.  Lungs clear.  Heart regular rate and rhythm.  Assessment:  Acute bacterial rhinosinusitis  Sore throat - Plan: POCT rapid strep A, CANCELED: Strep A DNA probe    Plan:   Meds ordered this encounter  Medications  . amoxicillin-clavulanate (AUGMENTIN) 875-125 MG tablet    Sig: Take 1 tablet by mouth 2 (two) times daily.    Dispense:  28 tablet    Refill:  0    Order Specific Question:   Supervising Provider    Answer:   Merlyn AlbertLUKING, WILLIAM S [[id number]]  nasacort AQ as directed Allegra 180 mg once a day Hydrogen peroxide mixed warm water 1:1; gently irrigate both ears as directed as needed cerumen.  Call back by the end of the week if no improvement, sooner if worse.

## 2018-03-29 DIAGNOSIS — M47812 Spondylosis without myelopathy or radiculopathy, cervical region: Secondary | ICD-10-CM | POA: Diagnosis not present

## 2018-03-29 DIAGNOSIS — M5442 Lumbago with sciatica, left side: Secondary | ICD-10-CM | POA: Diagnosis not present

## 2018-03-29 DIAGNOSIS — M546 Pain in thoracic spine: Secondary | ICD-10-CM | POA: Diagnosis not present

## 2018-12-01 DIAGNOSIS — F419 Anxiety disorder, unspecified: Secondary | ICD-10-CM | POA: Diagnosis not present

## 2018-12-08 DIAGNOSIS — F419 Anxiety disorder, unspecified: Secondary | ICD-10-CM | POA: Diagnosis not present

## 2018-12-08 DIAGNOSIS — F329 Major depressive disorder, single episode, unspecified: Secondary | ICD-10-CM | POA: Diagnosis not present

## 2018-12-14 DIAGNOSIS — J019 Acute sinusitis, unspecified: Secondary | ICD-10-CM | POA: Diagnosis not present

## 2018-12-14 DIAGNOSIS — F419 Anxiety disorder, unspecified: Secondary | ICD-10-CM | POA: Diagnosis not present

## 2018-12-14 DIAGNOSIS — F329 Major depressive disorder, single episode, unspecified: Secondary | ICD-10-CM | POA: Diagnosis not present

## 2018-12-27 DIAGNOSIS — F411 Generalized anxiety disorder: Secondary | ICD-10-CM | POA: Diagnosis not present

## 2019-03-27 DIAGNOSIS — S233XXA Sprain of ligaments of thoracic spine, initial encounter: Secondary | ICD-10-CM | POA: Diagnosis not present

## 2019-03-27 DIAGNOSIS — S338XXA Sprain of other parts of lumbar spine and pelvis, initial encounter: Secondary | ICD-10-CM | POA: Diagnosis not present

## 2019-03-27 DIAGNOSIS — S134XXA Sprain of ligaments of cervical spine, initial encounter: Secondary | ICD-10-CM | POA: Diagnosis not present

## 2019-05-25 DIAGNOSIS — S134XXA Sprain of ligaments of cervical spine, initial encounter: Secondary | ICD-10-CM | POA: Diagnosis not present

## 2019-05-25 DIAGNOSIS — S338XXA Sprain of other parts of lumbar spine and pelvis, initial encounter: Secondary | ICD-10-CM | POA: Diagnosis not present

## 2019-05-25 DIAGNOSIS — S233XXA Sprain of ligaments of thoracic spine, initial encounter: Secondary | ICD-10-CM | POA: Diagnosis not present

## 2019-06-07 DIAGNOSIS — S233XXA Sprain of ligaments of thoracic spine, initial encounter: Secondary | ICD-10-CM | POA: Diagnosis not present

## 2019-06-07 DIAGNOSIS — S338XXA Sprain of other parts of lumbar spine and pelvis, initial encounter: Secondary | ICD-10-CM | POA: Diagnosis not present

## 2019-06-07 DIAGNOSIS — S134XXA Sprain of ligaments of cervical spine, initial encounter: Secondary | ICD-10-CM | POA: Diagnosis not present

## 2019-07-05 DIAGNOSIS — S338XXA Sprain of other parts of lumbar spine and pelvis, initial encounter: Secondary | ICD-10-CM | POA: Diagnosis not present

## 2019-07-05 DIAGNOSIS — S233XXA Sprain of ligaments of thoracic spine, initial encounter: Secondary | ICD-10-CM | POA: Diagnosis not present

## 2019-07-05 DIAGNOSIS — S134XXA Sprain of ligaments of cervical spine, initial encounter: Secondary | ICD-10-CM | POA: Diagnosis not present

## 2019-07-09 DIAGNOSIS — S233XXA Sprain of ligaments of thoracic spine, initial encounter: Secondary | ICD-10-CM | POA: Diagnosis not present

## 2019-07-09 DIAGNOSIS — S134XXA Sprain of ligaments of cervical spine, initial encounter: Secondary | ICD-10-CM | POA: Diagnosis not present

## 2019-07-09 DIAGNOSIS — S338XXA Sprain of other parts of lumbar spine and pelvis, initial encounter: Secondary | ICD-10-CM | POA: Diagnosis not present

## 2019-07-19 DIAGNOSIS — Z6824 Body mass index (BMI) 24.0-24.9, adult: Secondary | ICD-10-CM | POA: Diagnosis not present

## 2019-07-19 DIAGNOSIS — Z1231 Encounter for screening mammogram for malignant neoplasm of breast: Secondary | ICD-10-CM | POA: Diagnosis not present

## 2019-07-19 DIAGNOSIS — Z01419 Encounter for gynecological examination (general) (routine) without abnormal findings: Secondary | ICD-10-CM | POA: Diagnosis not present

## 2019-07-25 DIAGNOSIS — S338XXA Sprain of other parts of lumbar spine and pelvis, initial encounter: Secondary | ICD-10-CM | POA: Diagnosis not present

## 2019-07-25 DIAGNOSIS — S134XXA Sprain of ligaments of cervical spine, initial encounter: Secondary | ICD-10-CM | POA: Diagnosis not present

## 2019-07-25 DIAGNOSIS — S233XXA Sprain of ligaments of thoracic spine, initial encounter: Secondary | ICD-10-CM | POA: Diagnosis not present

## 2019-07-27 DIAGNOSIS — Z13820 Encounter for screening for osteoporosis: Secondary | ICD-10-CM | POA: Diagnosis not present

## 2019-08-08 DIAGNOSIS — S233XXA Sprain of ligaments of thoracic spine, initial encounter: Secondary | ICD-10-CM | POA: Diagnosis not present

## 2019-08-08 DIAGNOSIS — S134XXA Sprain of ligaments of cervical spine, initial encounter: Secondary | ICD-10-CM | POA: Diagnosis not present

## 2019-08-08 DIAGNOSIS — S338XXA Sprain of other parts of lumbar spine and pelvis, initial encounter: Secondary | ICD-10-CM | POA: Diagnosis not present

## 2019-08-15 DIAGNOSIS — S134XXA Sprain of ligaments of cervical spine, initial encounter: Secondary | ICD-10-CM | POA: Diagnosis not present

## 2019-08-15 DIAGNOSIS — S233XXA Sprain of ligaments of thoracic spine, initial encounter: Secondary | ICD-10-CM | POA: Diagnosis not present

## 2019-08-15 DIAGNOSIS — S338XXA Sprain of other parts of lumbar spine and pelvis, initial encounter: Secondary | ICD-10-CM | POA: Diagnosis not present

## 2019-08-22 DIAGNOSIS — S338XXA Sprain of other parts of lumbar spine and pelvis, initial encounter: Secondary | ICD-10-CM | POA: Diagnosis not present

## 2019-08-22 DIAGNOSIS — S134XXA Sprain of ligaments of cervical spine, initial encounter: Secondary | ICD-10-CM | POA: Diagnosis not present

## 2019-08-22 DIAGNOSIS — S233XXA Sprain of ligaments of thoracic spine, initial encounter: Secondary | ICD-10-CM | POA: Diagnosis not present

## 2019-09-11 DIAGNOSIS — Z20828 Contact with and (suspected) exposure to other viral communicable diseases: Secondary | ICD-10-CM | POA: Diagnosis not present

## 2019-09-12 DIAGNOSIS — S338XXA Sprain of other parts of lumbar spine and pelvis, initial encounter: Secondary | ICD-10-CM | POA: Diagnosis not present

## 2019-09-12 DIAGNOSIS — S233XXA Sprain of ligaments of thoracic spine, initial encounter: Secondary | ICD-10-CM | POA: Diagnosis not present

## 2019-09-12 DIAGNOSIS — S134XXA Sprain of ligaments of cervical spine, initial encounter: Secondary | ICD-10-CM | POA: Diagnosis not present

## 2020-01-17 DIAGNOSIS — Z23 Encounter for immunization: Secondary | ICD-10-CM | POA: Diagnosis not present

## 2020-02-08 DIAGNOSIS — Z23 Encounter for immunization: Secondary | ICD-10-CM | POA: Diagnosis not present

## 2020-04-08 DIAGNOSIS — K581 Irritable bowel syndrome with constipation: Secondary | ICD-10-CM | POA: Diagnosis not present

## 2020-04-08 DIAGNOSIS — Z1211 Encounter for screening for malignant neoplasm of colon: Secondary | ICD-10-CM | POA: Diagnosis not present

## 2020-04-22 DIAGNOSIS — K5904 Chronic idiopathic constipation: Secondary | ICD-10-CM | POA: Diagnosis not present

## 2020-04-22 DIAGNOSIS — Z1211 Encounter for screening for malignant neoplasm of colon: Secondary | ICD-10-CM | POA: Diagnosis not present

## 2020-04-22 DIAGNOSIS — R140 Abdominal distension (gaseous): Secondary | ICD-10-CM | POA: Diagnosis not present

## 2020-05-12 DIAGNOSIS — Z1211 Encounter for screening for malignant neoplasm of colon: Secondary | ICD-10-CM | POA: Diagnosis not present

## 2020-05-12 DIAGNOSIS — K635 Polyp of colon: Secondary | ICD-10-CM | POA: Diagnosis not present

## 2020-05-12 DIAGNOSIS — D120 Benign neoplasm of cecum: Secondary | ICD-10-CM | POA: Diagnosis not present

## 2020-09-09 DIAGNOSIS — L570 Actinic keratosis: Secondary | ICD-10-CM | POA: Diagnosis not present

## 2020-09-09 DIAGNOSIS — L821 Other seborrheic keratosis: Secondary | ICD-10-CM | POA: Diagnosis not present

## 2020-09-09 DIAGNOSIS — Z79899 Other long term (current) drug therapy: Secondary | ICD-10-CM | POA: Diagnosis not present

## 2020-09-09 DIAGNOSIS — L298 Other pruritus: Secondary | ICD-10-CM | POA: Diagnosis not present

## 2020-09-18 DIAGNOSIS — D45 Polycythemia vera: Secondary | ICD-10-CM | POA: Diagnosis not present

## 2020-09-18 DIAGNOSIS — L298 Other pruritus: Secondary | ICD-10-CM | POA: Diagnosis not present

## 2020-09-29 DIAGNOSIS — R6882 Decreased libido: Secondary | ICD-10-CM | POA: Diagnosis not present

## 2020-09-29 DIAGNOSIS — Z6824 Body mass index (BMI) 24.0-24.9, adult: Secondary | ICD-10-CM | POA: Diagnosis not present

## 2020-09-29 DIAGNOSIS — Z1231 Encounter for screening mammogram for malignant neoplasm of breast: Secondary | ICD-10-CM | POA: Diagnosis not present

## 2020-09-29 DIAGNOSIS — Z01419 Encounter for gynecological examination (general) (routine) without abnormal findings: Secondary | ICD-10-CM | POA: Diagnosis not present

## 2020-09-29 DIAGNOSIS — M2550 Pain in unspecified joint: Secondary | ICD-10-CM | POA: Diagnosis not present

## 2020-10-14 ENCOUNTER — Encounter: Payer: Self-pay | Admitting: Family Medicine

## 2020-10-14 ENCOUNTER — Other Ambulatory Visit: Payer: Self-pay

## 2020-10-14 ENCOUNTER — Ambulatory Visit (INDEPENDENT_AMBULATORY_CARE_PROVIDER_SITE_OTHER): Payer: BC Managed Care – PPO | Admitting: Family Medicine

## 2020-10-14 ENCOUNTER — Ambulatory Visit (HOSPITAL_COMMUNITY)
Admission: RE | Admit: 2020-10-14 | Discharge: 2020-10-14 | Disposition: A | Discharge status: Home / Self Care | Payer: BC Managed Care – PPO | Source: Ambulatory Visit | Attending: Family Medicine | Admitting: Family Medicine

## 2020-10-14 VITALS — BP 118/64 | HR 91 | Temp 97.5°F | Ht 63.0 in | Wt 143.0 lb

## 2020-10-14 DIAGNOSIS — M79671 Pain in right foot: Secondary | ICD-10-CM | POA: Insufficient documentation

## 2020-10-14 DIAGNOSIS — M2011 Hallux valgus (acquired), right foot: Secondary | ICD-10-CM | POA: Diagnosis not present

## 2020-10-14 MED ORDER — MELOXICAM 15 MG PO TABS
15.0000 mg | ORAL_TABLET | Freq: Every day | ORAL | 0 refills | Status: DC
Start: 2020-10-14 — End: 2021-07-08

## 2020-10-14 NOTE — Progress Notes (Signed)
Patient ID: Darlene Galvan, female    DOB: January 30, 1966, 54 y.o.   MRN: [id number]   Chief Complaint  Patient presents with  . Foot Pain   Subjective:  CC: right toe/ball of foot pain  This is a new problem.  Presents today with a complaint of right toe/ball of foot pain.  Was wearing new boots with a high heel and doing a lot of work around the home and as the day progresses noticed that her foot started hurting.  Symptoms started around 1.5 weeks ago.  She describes this pain as pins-and-needles and throbbing.  Denies  swelling,  bruising,  Redness, or  warmth.  Denies fever, chills.  Has tried ice/heat, elevation, ibuprofen and Aleve.  Nothing has resolved the pain.  She is concerned that there is a fracture, as the pain has increased and has been present for 1-1/2 weeks.  There is no known injury.   hurt right foot 2 weeks ago. Walking in new high heel boots. Walking is extremely painful. Tried ibuprofen and aleve.  Would like a flu vaccine today.     Medical History Darlene Galvan has a past medical history of Allergy.   Outpatient Encounter Medications as of 10/14/2020  Medication Sig  . buPROPion (WELLBUTRIN SR) 150 MG 12 hr tablet Take 150 mg by mouth daily.  Marland Kitchen desvenlafaxine (PRISTIQ) 50 MG 24 hr tablet desvenlafaxine succinate ER 50 mg tablet,extended release 24 hr  TAKE 1 TABLET BY MOUTH EVERY DAY  . estradiol (ESTRACE) 1 MG tablet   . meloxicam (MOBIC) 15 MG tablet Take 1 tablet (15 mg total) by mouth daily.  . [DISCONTINUED] amoxicillin-clavulanate (AUGMENTIN) 875-125 MG tablet Take 1 tablet by mouth 2 (two) times daily.  . [DISCONTINUED] buPROPion (WELLBUTRIN SR) 150 MG 12 hr tablet   . [DISCONTINUED] VIIBRYD 20 MG TABS    No facility-administered encounter medications on file as of 10/14/2020.     Review of Systems  Constitutional: Negative for chills and fever.  HENT: Negative for ear pain.   Respiratory: Negative for shortness of breath.   Cardiovascular: Negative for  chest pain.  Musculoskeletal: Negative for joint swelling.       Right great toe joint pain x 1.5 weeks.     Vitals BP 118/64   Pulse 91   Temp (!) 97.5 F (36.4 C)   Ht 5\' 3"  (1.6 m)   Wt 143 lb (64.9 kg)   SpO2 97%   BMI 25.33 kg/m   Objective:   Physical Exam Vitals and nursing note reviewed.  Constitutional:      Appearance: Normal appearance.  Cardiovascular:     Rate and Rhythm: Normal rate and regular rhythm.     Heart sounds: Normal heart sounds.  Pulmonary:     Effort: Pulmonary effort is normal.     Breath sounds: Normal breath sounds.  Musculoskeletal:        General: Tenderness present. No swelling, deformity or signs of injury.     Comments: Right great toe joint with pain. Pain extends to ball of foot. No swelling, no bruising. No known injury. Wearing high heel boots and working around house. Pins and needles and throbbing pain.   Skin:    General: Skin is warm and dry.  Neurological:     General: No focal deficit present.     Mental Status: She is alert.  Psychiatric:        Behavior: Behavior normal.      Assessment and Plan  1. Right foot pain - meloxicam (MOBIC) 15 MG tablet; Take 1 tablet (15 mg total) by mouth daily.  Dispense: 30 tablet; Refill: 0 - DG Foot Complete Right - Ambulatory referral to Orthopedic Surgery   Will send to [facility] for stat foot x-ray to rule-out fracture. If fracture found: will send to orthopedic urgent care in [place] tonight for after hours clinic.   If no fracture found, will continue conservative measures and take meloxicam once daily with food.   Will notify once x-ray results become available.  Update: Foot x-ray reveals: No acute fracture, mild hallux valgus. Nurse called with results, will continue with conservative measures as previously discussed, she will use ice, avoid high-heeled shoes, wear shoes with a wide toe box, take meloxicam with food as prescribed.  Will make orthopedic  referral.  Agrees with plan of care discussed today. Understands warning signs to seek further care: Chest pain, shortness of breath, any significant change in health, any changes in ability to mobilize. Understands to follow-up as needed at this office, will make orthopedic referral for definitive treatment.   Dorena Bodo, FNP-C 10/14/2020

## 2020-10-16 ENCOUNTER — Ambulatory Visit: Payer: Self-pay

## 2020-10-20 ENCOUNTER — Emergency Department (HOSPITAL_COMMUNITY)
Admission: EM | Admit: 2020-10-20 | Discharge: 2020-10-20 | Disposition: A | Discharge status: Home / Self Care | Payer: BC Managed Care – PPO | Attending: Emergency Medicine | Admitting: Emergency Medicine

## 2020-10-20 ENCOUNTER — Emergency Department (HOSPITAL_COMMUNITY): Payer: BC Managed Care – PPO

## 2020-10-20 ENCOUNTER — Other Ambulatory Visit: Payer: Self-pay

## 2020-10-20 DIAGNOSIS — R519 Headache, unspecified: Secondary | ICD-10-CM | POA: Insufficient documentation

## 2020-10-20 DIAGNOSIS — J3489 Other specified disorders of nose and nasal sinuses: Secondary | ICD-10-CM | POA: Insufficient documentation

## 2020-10-20 DIAGNOSIS — R110 Nausea: Secondary | ICD-10-CM | POA: Insufficient documentation

## 2020-10-20 DIAGNOSIS — R42 Dizziness and giddiness: Secondary | ICD-10-CM | POA: Diagnosis not present

## 2020-10-20 DIAGNOSIS — I10 Essential (primary) hypertension: Secondary | ICD-10-CM | POA: Diagnosis not present

## 2020-10-20 DIAGNOSIS — R202 Paresthesia of skin: Secondary | ICD-10-CM | POA: Diagnosis not present

## 2020-10-20 LAB — CBC
HCT: 40.1 % (ref 36.0–46.0)
Hemoglobin: 13.3 g/dL (ref 12.0–15.0)
MCH: 33.1 pg (ref 26.0–34.0)
MCHC: 33.2 g/dL (ref 30.0–36.0)
MCV: 99.8 fL (ref 80.0–100.0)
Platelets: 266 10*3/uL (ref 150–400)
RBC: 4.02 MIL/uL (ref 3.87–5.11)
RDW: 11.8 % (ref 11.5–15.5)
WBC: 8 10*3/uL (ref 4.0–10.5)
nRBC: 0 % (ref 0.0–0.2)

## 2020-10-20 LAB — COMPREHENSIVE METABOLIC PANEL
ALT: 22 U/L (ref 0–44)
AST: 25 U/L (ref 15–41)
Albumin: 3.7 g/dL (ref 3.5–5.0)
Alkaline Phosphatase: 55 U/L (ref 38–126)
Anion gap: 13 (ref 5–15)
BUN: 10 mg/dL (ref 6–20)
CO2: 21 mmol/L — ABNORMAL LOW (ref 22–32)
Calcium: 8.5 mg/dL — ABNORMAL LOW (ref 8.9–10.3)
Chloride: 103 mmol/L (ref 98–111)
Creatinine, Ser: 0.82 mg/dL (ref 0.44–1.00)
GFR, Estimated: >60 mL/min (ref >60)
Glucose, Bld: 98 mg/dL (ref 70–99)
Potassium: 3.6 mmol/L (ref 3.5–5.1)
Sodium: 137 mmol/L (ref 135–145)
Total Bilirubin: 0.6 mg/dL (ref 0.3–1.2)
Total Protein: 6.5 g/dL (ref 6.5–8.1)

## 2020-10-20 LAB — URINALYSIS, ROUTINE W REFLEX MICROSCOPIC
Bilirubin Urine: NEGATIVE
Glucose, UA: NEGATIVE mg/dL
Ketones, ur: 5 mg/dL — AB
Leukocytes,Ua: NEGATIVE
Nitrite: NEGATIVE
Protein, ur: NEGATIVE mg/dL
Specific Gravity, Urine: 1.005 (ref 1.005–1.030)
pH: 6 (ref 5.0–8.0)

## 2020-10-20 LAB — I-STAT BETA HCG BLOOD, ED (MC, WL, AP ONLY): I-stat hCG, quantitative: <5 m[IU]/mL (ref <5)

## 2020-10-20 LAB — LIPASE, BLOOD: Lipase: 24 U/L (ref 11–51)

## 2020-10-20 MED ORDER — METOCLOPRAMIDE HCL 10 MG PO TABS: 10.0000 mg | ORAL_TABLET | Freq: Three times a day (TID) | ORAL | 0 refills | Status: DC | PRN

## 2020-10-20 MED ORDER — PROCHLORPERAZINE EDISYLATE 10 MG/2ML IJ SOLN
10.0000 mg | Freq: Once | INTRAMUSCULAR | Status: AC
  Administered 2020-10-20: 10 mg via INTRAVENOUS
  Filled 2020-10-20: qty 2

## 2020-10-20 NOTE — ED Provider Notes (Signed)
[facility] EMERGENCY DEPARTMENT Provider Note   CSN: [id number] Arrival date & time: 10/20/20  0865     History Chief Complaint  Patient presents with   Headache   Nausea   Tingling    Darlene Galvan is a 55 y.o. female with no significant past medical history presents the ED with complaints of acute onset severe headache that woke her from sleep at 3 AM this morning with associated left arm tingling sensation.  She tells me that ever since Christmas Day, she has endorsed mild intermittent nausea and mild headache symptoms.  She also states that she has had rhinorrhea and bilateral ear "fullness".  She endorses a history of sinus congestion.  She suspects that this is allergic related to the Christmas tree in her home.  She is fully immunized for COVID-19, as is her family.  However, she also notes that her daughter recently had bronchitis.  Last evening at 10 PM, she developed a worsening of her headache, but was able to go to bed without difficulty.  She then woke up at 3 AM with progressively worsening headache that is described as "sharp" on the upper left side of her head.  She states that it was persistent, unrelenting.  No obvious aggravating or alleviating factors.  This was associated with her mild left hand numbness/tingling.  She denied any other focal neurologic deficits.  She came to the ER given concern for intracranial hemorrhage because she had a friend who died from a brain bleed.  She took Excedrin and applied warm heating pad over her neck, without any significant relief.  She denies any sore throat or cough, fevers or chills, abdominal pain, chest pain or shortness of breath, precipitating trauma, neck pain, eye pain, room spinning dizziness, blurred vision, gait disturbance, weakness, or other focal neurologic deficits.  HPI     Past Medical History:  Diagnosis Date   Allergy     Patient Active Problem List   Diagnosis Date Noted   Right  foot pain 10/14/2020    Past Surgical History:  Procedure Laterality Date   CESAREAN SECTION     knee pain Right    NASAL SINUS SURGERY  2004     OB History   No obstetric history on file.     Family History  Problem Relation Age of Onset   Cancer Mother        cervical   Hypertension Father    Hyperlipidemia Father     Social History   Tobacco Use   Smoking status: Never Smoker   Smokeless tobacco: Never Used    Home Medications Prior to Admission medications   Medication Sig Start Date End Date Taking? Authorizing Provider  ALPRAZolam Prudy Feeler) 0.5 MG tablet Take 0.5 mg by mouth at bedtime as needed for sleep or anxiety. 10/01/20  Yes [provider]  buPROPion (WELLBUTRIN SR) 150 MG 12 hr tablet Take 150 mg by mouth daily.   Yes [provider]  desvenlafaxine (PRISTIQ) 50 MG 24 hr tablet Take 50 mg by mouth daily.   Yes [provider]  estradiol (ESTRACE) 1 MG tablet Take 1 mg by mouth daily. 10/21/17  Yes [provider]  meloxicam (MOBIC) 15 MG tablet Take 1 tablet (15 mg total) by mouth daily. Patient taking differently: Take 15 mg by mouth daily as needed for pain. 10/14/20  Yes Novella Olive, NP  metoCLOPramide (REGLAN) 10 MG tablet Take 1 tablet (10 mg total) by mouth  every 8 (eight) hours as needed (refractory migraine (not improved with Excedrin)). 10/20/20  Yes Lorelee New, PA-C    Allergies    Codeine and Sulfa antibiotics  Review of Systems   Review of Systems  All other systems reviewed and are negative.   Physical Exam Updated Vital Signs BP (!) 157/125    Pulse 64    Temp 98 F (36.7 C) (Oral)    Resp 12    SpO2 97%   Physical Exam Vitals and nursing note reviewed. Exam conducted with a chaperone present.  Constitutional:      Appearance: Normal appearance.  HENT:     Head: Normocephalic and atraumatic.     Right Ear: Tympanic membrane, ear canal and external ear normal. There is no impacted  cerumen.     Left Ear: Tympanic membrane, ear canal and external ear normal. There is no impacted cerumen.     Nose: Nose normal.  Eyes:     General: No scleral icterus.    Extraocular Movements: Extraocular movements intact.     Conjunctiva/sclera: Conjunctivae normal.     Pupils: Pupils are equal, round, and reactive to light.     Comments: PERRL and EOM intact.  No nystagmus.  No conjunctival injection.  No photophobia.  Neck:     Comments: No meningismus. Cardiovascular:     Rate and Rhythm: Normal rate and regular rhythm.     Pulses: Normal pulses.     Heart sounds: Normal heart sounds.  Pulmonary:     Effort: Pulmonary effort is normal. No respiratory distress.     Breath sounds: Normal breath sounds.  Abdominal:     General: Abdomen is flat. There is no distension.     Palpations: Abdomen is soft.     Tenderness: There is no abdominal tenderness. There is no guarding.  Musculoskeletal:     Cervical back: Normal range of motion. No rigidity.  Skin:    General: Skin is dry.  Neurological:     General: No focal deficit present.     Mental Status: She is alert and oriented to person, place, and time.     GCS: GCS eye subscore is 4. GCS verbal subscore is 5. GCS motor subscore is 6.     Cranial Nerves: No cranial nerve deficit.     Sensory: No sensory deficit.     Motor: No weakness.     Coordination: Coordination normal.     Gait: Gait normal.     Comments: CN II through XII grossly intact.  No focal deficits.  ROM, strength, and sensation intact and symmetric throughout.  Peripheral pulses intact and symmetric.  Psychiatric:        Mood and Affect: Mood normal.        Behavior: Behavior normal.        Thought Content: Thought content normal.     ED Results / Procedures / Treatments   Labs (all labs ordered are listed, but only abnormal results are displayed) Labs Reviewed  COMPREHENSIVE METABOLIC PANEL - Abnormal; Notable for the following components:      Result  Value   CO2 21 (*)    Calcium 8.5 (*)    All other components within normal limits  URINALYSIS, ROUTINE W REFLEX MICROSCOPIC - Abnormal; Notable for the following components:   Color, Urine STRAW (*)    Hgb urine dipstick SMALL (*)    Ketones, ur 5 (*)    Bacteria, UA RARE (*)  All other components within normal limits  LIPASE, BLOOD  CBC  I-STAT BETA HCG BLOOD, ED ([facility], [facility], [facility] ONLY)    EKG None  Radiology CT Head Wo Contrast  Result Date: 10/20/2020 CLINICAL DATA:  Headache, chronic, new features or increased frequency. Additional history provided: Patient reports worst headache of life since Christmas, tingling in bilateral hands and feet, nausea, intermittent dizziness. EXAM: CT HEAD WITHOUT CONTRAST TECHNIQUE: Contiguous axial images were obtained from the base of the skull through the vertex without intravenous contrast. COMPARISON:  Brain MRI 10/06/2010. FINDINGS: Brain: Mild cerebral atrophy. There is no acute intracranial hemorrhage. No demarcated cortical infarct. No extra-axial fluid collection. No evidence of intracranial mass. No midline shift. Vascular: No hyperdense vessel. Skull: Normal. Negative for fracture or focal lesion. Sinuses/Orbits: Visualized orbits show no acute finding. Postsurgical appearance of the paranasal sinuses. Trace ethmoid sinus mucosal thickening at the imaged levels. Minimal fluid within the right mastoid air cells. IMPRESSION: No evidence of acute intracranial abnormality. Mild cerebral atrophy, progressed as compared to the brain MRI of 10/06/2010. Trace ethmoid sinus mucosal thickening. Electronically Signed   By: Kellie Simmering DO   On: 10/20/2020 08:33    Procedures Procedures (including critical care time)  Medications Ordered in ED Medications  prochlorperazine (COMPAZINE) injection 10 mg (10 mg Intravenous Given 10/20/20 1703)    ED Course  I have reviewed the triage vital signs and the nursing notes.  Pertinent labs & imaging results that  were available during my care of the patient were reviewed by me and considered in my medical decision making (see chart for details).    MDM Rules/Calculators/A&P                          Laboratory work-up and CT imaging was obtained after patient first arrived to the ED.  I did not evaluate her until approximately 8 hours afterwards, at which point she felt as though her symptoms had spontaneously resolved.  She was well-appearing and in no acute distress.  There were no focal neurologic deficits on my examination and her symptoms have improved.   Labs CBC: No anemia or leukocytosis concerning for infection. Lipase: Within normal limits. CMP: Unremarkable.  Imaging CT head without contrast was obtained while patient was in triage given her report of worse headache of life.  I personally reviewed CT findings which demonstrate no evidence of acute intracranial abnormality.  Pt HA treated and improved while in ED.  Presentation is like pts typical HA and non concerning for [facility], ICH, Meningitis, or temporal arteritis.  Lower suspicion for venous sinus thrombosis, particularly given her spontaneous improvement and my suspicion that it could be related to sinus congestion or her history of (infrequent) migraines.  No hx of clots or clotting disorder.  No neurologic deficits.  She is alert and oriented, does not appear altered.  Doubt encephalitis.  Pt is afebrile with no focal neuro deficits, nuchal rigidity, or change in vision. Pt is to follow up with PCP to discuss prophylactic medication. Pt verbalizes understanding and is agreeable with plan to dc.   Patient with elevated BP today. Unlikely to be related to CC and patient is without symptoms concerning for end organ damage. I discussed with patient their elevated blood pressure and need for close outpatient management of their hypertension. Patient counseled on long-term effects of elevated BP including kidney damage, vascular damage,  retinopathy, and risk of stroke and other dangerous outcomes. Patient understanding  of close PCP follow up.  Given suspicion that this really be related to a sinusitis, recommending Flonase and antihistamines.  She states that she has not been taking either.  She understands and will pick up medications over-the-counter.  She will also follow-up with her primary care provider regarding today's encounter and for ongoing evaluation.  I will order an ambulatory referral to neurology given her new presentation of headache.  Immediately prior to discharge, patient states that her headache symptoms have returned, migrating slightly when compared to prior HA.  She also is endorsing photophobia.  She is tearful with her husband now at bedside.  Will provide Compazine.    On subsequent evaluation, patient is again asymptomatic now back to baseline.  She states that she feels Improved with Compazine.  She did ambulate to the bathroom independently without any difficulty.  I will asked that she continue with Excedrin Migraine for her mainstay treatment.  We will also prescribe her Reglan 10 mg p.o. which she can take for refractory migraine management.  She will follow up with neurology and her primary care provider.   Final Clinical Impression(s) / ED Diagnoses Final diagnoses:  Acute nonintractable headache, unspecified headache type    Rx / DC Orders ED Discharge Orders         Ordered    Ambulatory referral to Neurology       Comments: An appointment is requested in approximately: 1   10/20/20 1643    metoCLOPramide (REGLAN) 10 MG tablet  Every 8 hours PRN        10/20/20 1759           Lorelee New, PA-C 10/20/20 1759    Margarita Grizzle, MD 10/27/20 1311

## 2020-10-20 NOTE — ED Triage Notes (Signed)
Pt reports worst headache of her life, feeling like electrical impulses, since Christmas, along with tingling in bilateral hands and feet. Also endorses nausea and intermittent dizziness.

## 2020-10-20 NOTE — Discharge Instructions (Addendum)
Your work-up today in the ED was reassuring.  I suspect that your acute onset left-sided headache with nausea and photophobia was migraine.  Please continue take your Excedrin Migraine at home as an abortive therapy.  I have also prescribed you metoclopramide 10 mg which I would like you to take as needed for refractory headache, unimproved with the Excedrin.    You need to follow-up with your primary care provider regarding today's encounter.  I have also placed an ambulatory referral to [facility] here in [place], [place].  You should receive a phone call from them shortly.  However if you do not, please call them to schedule appointment.  Please return to the ED or seek immediate medical attention should you experience any new or worsening symptoms.

## 2020-10-20 NOTE — ED Notes (Signed)
Care assumed. Patient resting comfortably on stretcher in poc. Patient c/o fatigue. Patient ambulated to bathroom independently to provide urine sample. Urine sample sent to lab. Monitoring placed back on patient. Call bell in reach. Will continue to monitor.

## 2020-10-20 NOTE — ED Notes (Signed)
Discharge instructions provided to patient. Verbalized understanding. Alert and oriented. IV lock removed. Ambulated with steady gait out of ED with family. 

## 2020-10-23 ENCOUNTER — Other Ambulatory Visit: Payer: BC Managed Care – PPO

## 2020-10-23 ENCOUNTER — Ambulatory Visit: Payer: BC Managed Care – PPO

## 2020-10-23 DIAGNOSIS — Z20822 Contact with and (suspected) exposure to covid-19: Secondary | ICD-10-CM

## 2020-10-26 LAB — NOVEL CORONAVIRUS, NAA: SARS-CoV-2, NAA: NOT DETECTED

## 2020-10-27 ENCOUNTER — Encounter: Payer: Self-pay | Admitting: Orthopedic Surgery

## 2020-10-27 ENCOUNTER — Ambulatory Visit (INDEPENDENT_AMBULATORY_CARE_PROVIDER_SITE_OTHER): Payer: BC Managed Care – PPO | Admitting: Orthopedic Surgery

## 2020-10-27 VITALS — Ht 63.0 in | Wt 141.0 lb

## 2020-10-27 DIAGNOSIS — M7741 Metatarsalgia, right foot: Secondary | ICD-10-CM

## 2020-10-27 NOTE — Progress Notes (Signed)
Office Visit Note   Patient: Darlene Galvan           Date of Birth: 08-10-1966           MRN: [id number] Visit Date: 10/27/2020              Requested by: Babs Sciara, MD [address] [place],  [place] [address] PCP: Babs Sciara, MD  Chief Complaint  Patient presents with  . Right Foot - Pain      HPI: Patient presents today with a chief complaint of right forefoot pain.  She states that 1 week before Christmas she was wearing new boots with a high heel.  She developed pain on the bottom of her right forefoot.  She did have x-rays which were negative.  She notes that she has worn high heels her whole life but has recently tried to wear shoes that were more flat.  She also has tennis shoes that are supportive.  Assessment & Plan: Visit Diagnoses: No diagnosis found.  Plan: Finding consistent with cavus foot with forefoot overload.  Also early hammertoe second toe.  We talked about the natural history of this.  She will begin to work on Achilles stretching.  I have also given her a metatarsal pad to put in her shoe to see if this helps.  We talked about sole orthotics that she could purchase.  Again she needs to try and wear shoes that are appropriate for her foot is much as possible.  We will follow-up in a month if she is no better.  She is not interested in any surgery at this time also talked her about a toe spacer in the first webspace to help with some of the discomfort she has from the second toe drifting towards the great toe  Follow-Up Instructions: No follow-ups on file.   Ortho Exam  Patient is alert, oriented, no adenopathy, well-dressed, normal affect, normal respiratory effort. Right foot: No swelling no erythema no cellulitis skin is in good condition she has a palpable dorsalis pedis pulse.  She does have a considerable cavus foot.  She is tender under the first and second metatarsal heads.  She does have drifting of the second toe towards the great toe  with weightbearing.  X-rays previously done do not demonstrate any significant abnormality other than her cavus foot.  She does have some tightness with Achilles stretching  Imaging: No results found. No images are attached to the encounter.  Labs: No results found for: HGBA1C, ESRSEDRATE, CRP, LABURIC, REPTSTATUS, GRAMSTAIN, CULT, LABORGA   Lab Results  Component Value Date   ALBUMIN 3.7 10/20/2020    No results found for: MG No results found for: VD25OH  No results found for: PREALBUMIN CBC EXTENDED Latest Ref Rng & Units 10/20/2020 01/17/2010 01/15/2010  WBC 4.0 - 10.5 K/uL 8.0 9.3 7.9  RBC 3.87 - 5.11 MIL/uL 4.02 3.37(L) 4.21  HGB 12.0 - 15.0 g/dL 87.5 64.3 DELTA CHECK NOTED REPEATED TO VERIFY(L) 14.2  HCT 36.0 - 46.0 % 40.1 33.6(L) 41.8  PLT 150 - 400 K/uL 266 202 245     Body mass index is 24.98 kg/m.  Orders:  No orders of the defined types were placed in this encounter.  No orders of the defined types were placed in this encounter.    Procedures: No procedures performed  Clinical Data: No additional findings.  ROS:  All other systems negative, except as noted in the HPI. Review of Systems  Objective: Vital Signs: Ht 5\' 3"  (1.6 m)   Wt 141 lb (64 kg)   BMI 24.98 kg/m   Specialty Comments:  No specialty comments available.  PMFS History: Patient Active Problem List   Diagnosis Date Noted  . Right foot pain 10/14/2020   Past Medical History:  Diagnosis Date  . Allergy     Family History  Problem Relation Age of Onset  . Cancer Mother        cervical  . Hypertension Father   . Hyperlipidemia Father     Past Surgical History:  Procedure Laterality Date  . CESAREAN SECTION    . knee pain Right   . NASAL SINUS SURGERY  2004   Social History   Occupational History  . Not on file  Tobacco Use  . Smoking status: Never Smoker  . Smokeless tobacco: Never Used  Substance and Sexual Activity  . Alcohol use: Not on file  . Drug use: Not on  file  . Sexual activity: Not on file

## 2020-10-30 ENCOUNTER — Other Ambulatory Visit: Payer: BC Managed Care – PPO

## 2020-10-30 ENCOUNTER — Ambulatory Visit: Payer: BC Managed Care – PPO | Attending: Internal Medicine

## 2020-10-30 DIAGNOSIS — Z23 Encounter for immunization: Secondary | ICD-10-CM

## 2020-10-30 NOTE — Progress Notes (Signed)
   Covid-19 Vaccination Clinic  Name:  Darlene Galvan    MRN: [id number] DOB: [birth year]  10/30/2020  Darlene Galvan was observed post Covid-19 immunization for 15 minutes without incident. She was provided with Vaccine Information Sheet and instruction to access the V-Safe system.   Darlene Galvan was instructed to call 911 with any severe reactions post vaccine: Marland Kitchen Difficulty breathing  . Swelling of face and throat  . A fast heartbeat  . A bad rash all over body  . Dizziness and weakness   Immunizations Administered    Name Date Dose VIS Date Route   Pfizer COVID-19 Vaccine 10/30/2020  2:29 PM 0.3 mL 08/06/2020 Intramuscular   Manufacturer: [employer], [employer]   Lot: [id number]   NDC: [address]-1

## 2021-03-11 ENCOUNTER — Ambulatory Visit
Admission: EM | Admit: 2021-03-11 | Discharge: 2021-03-11 | Disposition: A | Discharge status: Home / Self Care | Payer: BC Managed Care – PPO

## 2021-03-11 ENCOUNTER — Encounter (HOSPITAL_BASED_OUTPATIENT_CLINIC_OR_DEPARTMENT_OTHER): Payer: Self-pay

## 2021-03-11 ENCOUNTER — Encounter: Payer: Self-pay | Admitting: Emergency Medicine

## 2021-03-11 ENCOUNTER — Other Ambulatory Visit: Payer: Self-pay

## 2021-03-11 ENCOUNTER — Emergency Department (HOSPITAL_BASED_OUTPATIENT_CLINIC_OR_DEPARTMENT_OTHER)
Admission: EM | Admit: 2021-03-11 | Discharge: 2021-03-11 | Disposition: A | Discharge status: Home / Self Care | Payer: BC Managed Care – PPO | Attending: Emergency Medicine | Admitting: Emergency Medicine

## 2021-03-11 DIAGNOSIS — W231XXA Caught, crushed, jammed, or pinched between stationary objects, initial encounter: Secondary | ICD-10-CM | POA: Diagnosis not present

## 2021-03-11 DIAGNOSIS — S91201A Unspecified open wound of right great toe with damage to nail, initial encounter: Secondary | ICD-10-CM | POA: Diagnosis not present

## 2021-03-11 DIAGNOSIS — S91209A Unspecified open wound of unspecified toe(s) with damage to nail, initial encounter: Secondary | ICD-10-CM

## 2021-03-11 DIAGNOSIS — S90931A Unspecified superficial injury of right great toe, initial encounter: Secondary | ICD-10-CM

## 2021-03-11 DIAGNOSIS — S99921A Unspecified injury of right foot, initial encounter: Secondary | ICD-10-CM | POA: Diagnosis not present

## 2021-03-11 MED ORDER — LIDOCAINE HCL 2 % IJ SOLN
10.0000 mL | Freq: Once | INTRAMUSCULAR | Status: AC
  Administered 2021-03-11: 200 mg
  Filled 2021-03-11: qty 20

## 2021-03-11 NOTE — Discharge Instructions (Signed)
Go to the ER for further evaluation and management. °

## 2021-03-11 NOTE — ED Triage Notes (Signed)
Wooden table fell on right great toe today.  Toe nail is lose.

## 2021-03-11 NOTE — ED Provider Notes (Signed)
[facility]    CSN: [id number] Arrival date & time: 03/11/21  1319      History   Chief Complaint No chief complaint on file.   HPI Darlene Galvan is a 55 y.o. female.   Reports that she stubbed her right great toe into the leg of a wooden table.  Reports that she has fungus under this nail and that the nail was raised a bit before the injury anyway.  Reports that when she hit the table with her toe over her entire toenail lifted up and backward toward her foot.  Reports that the area was bleeding a lot, reports that the area is very painful.  Has not attempted OTC treatment.  States that she just pushed her toenail back down, and then drove to the urgent care.  Denies previous symptoms.  Denies radiating pain, numbness, tingling, foreign body to the area, other symptoms.  ROS per HPI  The history is provided by the patient.    Past Medical History:  Diagnosis Date  . Allergy     Patient Active Problem List   Diagnosis Date Noted  . Right foot pain 10/14/2020    Past Surgical History:  Procedure Laterality Date  . CESAREAN SECTION    . knee pain Right   . NASAL SINUS SURGERY  2004    OB History   No obstetric history on file.      Home Medications    Prior to Admission medications   Medication Sig Start Date End Date Taking? Authorizing Provider  ALPRAZolam Prudy Feeler) 0.5 MG tablet Take 0.5 mg by mouth at bedtime as needed for sleep or anxiety. 10/01/20   [provider]  buPROPion (WELLBUTRIN SR) 150 MG 12 hr tablet Take 150 mg by mouth daily.    [provider]  desvenlafaxine (PRISTIQ) 50 MG 24 hr tablet Take 50 mg by mouth daily.    [provider]  estradiol (ESTRACE) 1 MG tablet Take 1 mg by mouth daily. 10/21/17   [provider]  meloxicam (MOBIC) 15 MG tablet Take 1 tablet (15 mg total) by mouth daily. Patient taking differently: Take 15 mg by mouth daily as needed for pain. 10/14/20   Novella Olive, NP   metoCLOPramide (REGLAN) 10 MG tablet Take 1 tablet (10 mg total) by mouth every 8 (eight) hours as needed (refractory migraine (not improved with Excedrin)). 10/20/20   Lorelee New, PA-C    Family History Family History  Problem Relation Age of Onset  . Cancer Mother        cervical  . Hypertension Father   . Hyperlipidemia Father     Social History Social History   Tobacco Use  . Smoking status: Never Smoker  . Smokeless tobacco: Never Used  Substance Use Topics  . Alcohol use: Yes  . Drug use: Never     Allergies   Codeine and Sulfa antibiotics   Review of Systems Review of Systems   Physical Exam Triage Vital Signs ED Triage Vitals  Enc Vitals Group     BP 03/11/21 1338 137/81     Pulse Rate 03/11/21 1338 66     Resp 03/11/21 1338 16     Temp 03/11/21 1338 97.6 F (36.4 C)     Temp Source 03/11/21 1338 Oral     SpO2 03/11/21 1338 98 %     Weight --      Height --      Head Circumference --  Peak Flow --      Pain Score 03/11/21 1339 5     Pain Loc --      Pain Edu? --      Excl. in GC? --    No data found.  Updated Vital Signs BP 137/81 (BP Location: Right Arm)   Pulse 66   Temp 97.6 F (36.4 C) (Oral)   Resp 16   SpO2 98%   Visual Acuity Right Eye Distance:   Left Eye Distance:   Bilateral Distance:    Right Eye Near:   Left Eye Near:    Bilateral Near:     Physical Exam Vitals and nursing note reviewed.  Constitutional:      General: She is not in acute distress.    Appearance: Normal appearance. She is well-developed and normal weight.  HENT:     Head: Normocephalic and atraumatic.     Nose: Nose normal.     Mouth/Throat:     Mouth: Mucous membranes are moist.     Pharynx: Oropharynx is clear.  Eyes:     Extraocular Movements: Extraocular movements intact.     Conjunctiva/sclera: Conjunctivae normal.     Pupils: Pupils are equal, round, and reactive to light.  Cardiovascular:     Rate and Rhythm: Normal rate and  regular rhythm.  Pulmonary:     Effort: Pulmonary effort is normal.  Musculoskeletal:        General: Normal range of motion.     Cervical back: Normal range of motion and neck supple.  Skin:    General: Skin is warm and dry.     Capillary Refill: Capillary refill takes less than 2 seconds.     Findings: Wound present.     Comments: The right toenail is avulsed, bleeding, with material in between the nail and the rest of the toe.  Bleeding is mild, no other drainage noted from the area, no bruising, no erythema to the rest of the toe.  Neurological:     General: No focal deficit present.     Mental Status: She is alert and oriented to person, place, and time.  Psychiatric:        Mood and Affect: Mood normal.        Behavior: Behavior normal.        Thought Content: Thought content normal.      UC Treatments / Results  Labs (all labs ordered are listed, but only abnormal results are displayed) Labs Reviewed - No data to display  EKG   Radiology No results found.  Procedures Procedures (including critical care time)  Medications Ordered in UC Medications - No data to display  Initial Impression / Assessment and Plan / UC Course  I have reviewed the triage vital signs and the nursing notes.  Pertinent labs & imaging results that were available during my care of the patient were reviewed by me and considered in my medical decision making (see chart for details).    Right foot toenail injury  Discussed with patient that she would be best served in the ER where there will have better resources to clean, assess, treat her injury Verbalized understanding is in agreement with treatment plan Will go to ER via POV Stable at discharge  Final Clinical Impressions(s) / UC Diagnoses   Final diagnoses:  Injury of toenail of right foot, initial encounter     Discharge Instructions     Go to the ER for further evaluation and management  ED Prescriptions    None      PDMP not reviewed this encounter.   Moshe Cipro, NP 03/11/21 1458

## 2021-03-11 NOTE — ED Notes (Signed)
Patient verbalizes understanding of discharge instructions. Opportunity for questioning and answers were provided. Armband removed by staff, pt discharged from ED. Pt. ambulatory and discharged home.  

## 2021-03-11 NOTE — ED Triage Notes (Signed)
Pt c/o of Right Great Toe Pain, nail bed is only attached at the nail bed. Pt went to the Urgent Care and they sent her here for treatment. Pt said she was moving her table and the leg of the table hit the front of the toe.

## 2021-03-11 NOTE — ED Provider Notes (Signed)
[facility] EMERGENCY DEPARTMENT Provider Note  CSN: [id number] Arrival date & time: 03/11/21 1444    History No chief complaint on file.   HPI  Darlene Galvan is a 55 y.o. female with prior history of onychomycosis of R great toe resistant to prior attempts to eradicate reports earlier today she was moving some furniture when she caught her toenail on the wooden leg of a table and lifted up her R great toenail. Some mild initial bleeding and pain. She went to [facility] and was referred to the ED for definitive treatment.    Past Medical History:  Diagnosis Date  . Allergy     Past Surgical History:  Procedure Laterality Date  . CESAREAN SECTION    . knee pain Right   . NASAL SINUS SURGERY  2004    Family History  Problem Relation Age of Onset  . Cancer Mother        cervical  . Hypertension Father   . Hyperlipidemia Father     Social History   Tobacco Use  . Smoking status: Never Smoker  . Smokeless tobacco: Never Used  Substance Use Topics  . Alcohol use: Yes  . Drug use: Never     Home Medications Prior to Admission medications   Medication Sig Start Date End Date Taking? Authorizing Provider  ALPRAZolam Prudy Feeler) 0.5 MG tablet Take 0.5 mg by mouth at bedtime as needed for sleep or anxiety. 10/01/20   [provider]  buPROPion (WELLBUTRIN SR) 150 MG 12 hr tablet Take 150 mg by mouth daily.    [provider]  desvenlafaxine (PRISTIQ) 50 MG 24 hr tablet Take 50 mg by mouth daily.    [provider]  estradiol (ESTRACE) 1 MG tablet Take 1 mg by mouth daily. 10/21/17   [provider]  meloxicam (MOBIC) 15 MG tablet Take 1 tablet (15 mg total) by mouth daily. Patient taking differently: Take 15 mg by mouth daily as needed for pain. 10/14/20   Novella Olive, NP  metoCLOPramide (REGLAN) 10 MG tablet Take 1 tablet (10 mg total) by mouth every 8 (eight) hours as needed (refractory migraine (not improved with Excedrin)). 10/20/20    Lorelee New, PA-C     Allergies    Codeine and Sulfa antibiotics   Review of Systems   Review of Systems A comprehensive review of systems was completed and negative except as noted in HPI.    Physical Exam BP (!) 144/86 (BP Location: Left Arm)   Pulse 66   Temp 97.7 F (36.5 C) (Temporal)   Resp 14   Ht 5\' 3"  (1.6 m)   Wt 60.3 kg   SpO2 100%   BMI 23.56 kg/m   Physical Exam Vitals and nursing note reviewed.  HENT:     Head: Normocephalic.     Nose: Nose normal.  Eyes:     Extraocular Movements: Extraocular movements intact.  Pulmonary:     Effort: Pulmonary effort is normal.  Musculoskeletal:        General: Normal range of motion.     Cervical back: Neck supple.     Comments: Dried blood under R great toenail which is partially avulsed, tender to palpation  Skin:    Findings: No rash (on exposed skin).  Neurological:     Mental Status: She is alert and oriented to person, place, and time.  Psychiatric:        Mood and Affect: Mood normal.  ED Results / Procedures / Treatments   Labs (all labs ordered are listed, but only abnormal results are displayed) Labs Reviewed - No data to display  EKG None   Radiology No results found.  Procedures Procedures  Medications Ordered in the ED Medications  lidocaine (XYLOCAINE) 2 % (with pres) injection 200 mg (200 mg Infiltration Given by Other 03/11/21 1515)     MDM Rules/Calculators/A&P MDM R great toe was anesthetized with lidocaine 2% using a digital block technique. Nail/nail bed was cleaned and irrigated, no nail bed laceration in need of repair. Nail is still well attached to nail matrix. Will leave in place for now and refer to Podiatry for further management. Advised the nail will likely fall off on its own and that when it grows back it may not have a normal appearance. Nail was dressed with iodoform gauze and buddy taped to adjacent tow. Given wound care instructions . ED Course  I  have reviewed the triage vital signs and the nursing notes.  Pertinent labs & imaging results that were available during my care of the patient were reviewed by me and considered in my medical decision making (see chart for details).     Final Clinical Impression(s) / ED Diagnoses Final diagnoses:  Nail avulsion of toe, initial encounter    Rx / DC Orders ED Discharge Orders    None       Pollyann Savoy, MD 03/11/21 [phone]

## 2021-05-04 ENCOUNTER — Ambulatory Visit (INDEPENDENT_AMBULATORY_CARE_PROVIDER_SITE_OTHER): Payer: BC Managed Care – PPO | Admitting: Podiatry

## 2021-05-04 ENCOUNTER — Other Ambulatory Visit: Payer: Self-pay

## 2021-05-04 DIAGNOSIS — B351 Tinea unguium: Secondary | ICD-10-CM

## 2021-05-04 DIAGNOSIS — M79675 Pain in left toe(s): Secondary | ICD-10-CM

## 2021-05-04 NOTE — Progress Notes (Signed)
   Subjective: 55 y.o. female presenting today as a new patient for evaluation of chronic toenail fungus to the right great toe.  Patient states that she has been treated for several years for onychomycosis of the toenail including topical, oral treatment modalities.  There is been no success.  Most recently the toenail fell off after a slight traumatic injury when she dropped a piece of furniture on it.  The toenail came off in its entirety and new nail is beginning to grow.  She presents for further treatment and evaluation  Past Medical History:  Diagnosis Date   Allergy     Objective: Physical Exam General: The patient is alert and oriented x3 in no acute distress.  Dermatology: New nail growth noted to the right hallux nail plate.  The nail plate appears stable and dry with healthy new nail growth  Vascular: Palpable pedal pulses bilaterally. No edema or erythema noted. Capillary refill within normal limits.  Neurological: Epicritic and protective threshold grossly intact bilaterally.   Musculoskeletal Exam: Range of motion within normal limits to all pedal and ankle joints bilateral. Muscle strength 5/5 in all groups bilateral.   Assessment: #1 Onychomycosis of toenail right great toe #2  History of trauma right great toe nail plate with loss of the toenail and subsequent regrowth  Plan of Care:  #1 Patient was evaluated. #2  Today we discussed different treatment options including oral, topical, and laser antifungal treatment modalities.  At this time I do recommend topical antifungal as the nail grows out. #3 Tolcylen antifungal topical provided for the patient a check out #4 return to clinic as needed  *Retired Tourist information centre manager at [employer]   Felecia Shelling, DPM [facility]  Dr. Felecia Shelling, DPM    [address] [place], [place] [address]                Office [phone]  Fax [phone]

## 2021-07-06 ENCOUNTER — Telehealth: Payer: Self-pay | Admitting: Family Medicine

## 2021-07-06 NOTE — Telephone Encounter (Signed)
Schedule for 3 on wednesday

## 2021-07-06 NOTE — Telephone Encounter (Signed)
Patient is having some issues with tingling in hands/arms for 3 weeks and now having dizziness. Please advise where to add to schedule

## 2021-07-06 NOTE — Telephone Encounter (Signed)
4:10 PM on Tuesday or 3:10 PM on Wednesday

## 2021-07-08 ENCOUNTER — Other Ambulatory Visit: Payer: Self-pay

## 2021-07-08 ENCOUNTER — Ambulatory Visit (INDEPENDENT_AMBULATORY_CARE_PROVIDER_SITE_OTHER): Payer: BC Managed Care – PPO | Admitting: Family Medicine

## 2021-07-08 ENCOUNTER — Encounter: Payer: Self-pay | Admitting: Family Medicine

## 2021-07-08 VITALS — BP 153/91 | HR 76 | Temp 97.0°F | Wt 139.4 lb

## 2021-07-08 DIAGNOSIS — R519 Headache, unspecified: Secondary | ICD-10-CM | POA: Diagnosis not present

## 2021-07-08 DIAGNOSIS — G542 Cervical root disorders, not elsewhere classified: Secondary | ICD-10-CM

## 2021-07-08 DIAGNOSIS — M542 Cervicalgia: Secondary | ICD-10-CM | POA: Diagnosis not present

## 2021-07-08 DIAGNOSIS — M5432 Sciatica, left side: Secondary | ICD-10-CM

## 2021-07-08 NOTE — Progress Notes (Signed)
   Subjective:    Patient ID: Darlene Galvan, female    DOB: [birth year], 55 y.o.   MRN: [id number]  HPI Pt has been experiencing lower back pain constantly for a few years but in the past year the pain has become worse and has radiated down to left leg and hip area. Pt also is stiff in the mornings makes the first few steps hard.  If she rides in a car for any significant distance she has a tough time getting out moving about.  At times the leg feels weaker than it should be.  No falls.  3 weeks ago pt began to have prickly needle like feeling in hands and arms. Sometimes leg but mostly hands and arms. Pt states it felt like pins and needles. Also felt an itchy type of feeling.  Has been having progressive pins and needle sensation into the arms and hands and progressive numbness in both hands as well as perceived weakness in both arms and hands  Went to dermatologist but did not find anything. Happened last fall and winter but went away in the spring.   Pt hands go numb when she is not using them. Also had some dizziness last week. Began to use Osteo Flex with turmeric and that did seem to help some.   (Mom has degenerative disc disease)   Patient denies any severe headaches but occasionally has a bad headache once it woke her up no nausea or blurred vision or vomiting  Patient also relates intermittent neck pain that is been present for several years occasionally wakes her up at night grinds and pops when she turns  Review of Systems     Objective:   Physical Exam Reflexes good bilateral strength in hands good strength in arms good subjective tingling into both arms negative straight leg raise on the right slight increased discomfort on the left no weakness in dorsiflexion or plantarflexion no weakness with quadriceps or hamstring can walk on toes and walk on heels       Assessment & Plan:   Bilateral arm tingling as well as bilateral hand numbness and also weakness-concerning for the  possibility of cervical myelopathy issues.  At this point in time seems most important to move forward with doing a MRI of the cervical spine Will touch base with neurology to see if they recommend MRI of the brain as well In addition to this if MRIs are negative then referral to neurology for nerve conduction studies to rule out carpal tunnel-unlikely to be carpal tunnel since it is hitting both hands at the same time of occurrence.  Low back pain with sciatica consistent with nerve impingement but no sign of weakness.  No need to do MRI just yet.  Exercises were shown if not improving over the next 8 to 12 weeks MRI would be indicated Potentially if cervical issue shows a problem specialist will pursue forward with doing MRI of the lumbar spine as well Patient may benefit from injections hold off on any referral just yet  Patient does not want to be on any pain medicine she will use Tylenol as needed and follow-up based upon results of tests

## 2021-07-16 ENCOUNTER — Telehealth: Payer: Self-pay | Admitting: *Deleted

## 2021-07-16 DIAGNOSIS — R202 Paresthesia of skin: Secondary | ICD-10-CM

## 2021-07-16 NOTE — Telephone Encounter (Signed)
MRI order placed and sent pt a my chart message making her aware that MRI would be set up

## 2021-07-16 NOTE — Telephone Encounter (Signed)
Per Dr [name]:  Please go ahead with ordering MRI of the cervical spine due to tingling into both arms.  Please notify the patient that this is the course of action we are recommending.  This is based upon her last visit and also discussion with neurology Dr. Daisy Blossom

## 2021-07-18 ENCOUNTER — Encounter: Payer: Self-pay | Admitting: Family Medicine

## 2021-08-10 ENCOUNTER — Other Ambulatory Visit: Payer: Self-pay

## 2021-08-10 ENCOUNTER — Ambulatory Visit (HOSPITAL_COMMUNITY)
Admission: RE | Admit: 2021-08-10 | Discharge: 2021-08-10 | Disposition: A | Discharge status: Home / Self Care | Payer: BC Managed Care – PPO | Source: Ambulatory Visit | Attending: Family Medicine | Admitting: Family Medicine

## 2021-08-10 DIAGNOSIS — M50223 Other cervical disc displacement at C6-C7 level: Secondary | ICD-10-CM | POA: Diagnosis not present

## 2021-08-10 DIAGNOSIS — R202 Paresthesia of skin: Secondary | ICD-10-CM | POA: Insufficient documentation

## 2021-08-10 DIAGNOSIS — M5021 Other cervical disc displacement,  high cervical region: Secondary | ICD-10-CM | POA: Diagnosis not present

## 2021-08-10 DIAGNOSIS — M50221 Other cervical disc displacement at C4-C5 level: Secondary | ICD-10-CM | POA: Diagnosis not present

## 2021-08-12 ENCOUNTER — Other Ambulatory Visit: Payer: Self-pay

## 2021-08-12 DIAGNOSIS — G542 Cervical root disorders, not elsewhere classified: Secondary | ICD-10-CM

## 2021-08-25 DIAGNOSIS — G5622 Lesion of ulnar nerve, left upper limb: Secondary | ICD-10-CM | POA: Diagnosis not present

## 2021-08-25 DIAGNOSIS — M5412 Radiculopathy, cervical region: Secondary | ICD-10-CM | POA: Diagnosis not present

## 2021-08-25 DIAGNOSIS — G5602 Carpal tunnel syndrome, left upper limb: Secondary | ICD-10-CM | POA: Diagnosis not present

## 2021-09-03 DIAGNOSIS — R202 Paresthesia of skin: Secondary | ICD-10-CM | POA: Diagnosis not present

## 2021-09-03 DIAGNOSIS — R200 Anesthesia of skin: Secondary | ICD-10-CM | POA: Diagnosis not present

## 2021-09-03 DIAGNOSIS — M5412 Radiculopathy, cervical region: Secondary | ICD-10-CM | POA: Diagnosis not present

## 2021-09-15 DIAGNOSIS — Z6825 Body mass index (BMI) 25.0-25.9, adult: Secondary | ICD-10-CM | POA: Diagnosis not present

## 2021-09-15 DIAGNOSIS — R030 Elevated blood-pressure reading, without diagnosis of hypertension: Secondary | ICD-10-CM | POA: Diagnosis not present

## 2021-09-15 DIAGNOSIS — M5442 Lumbago with sciatica, left side: Secondary | ICD-10-CM | POA: Diagnosis not present

## 2021-09-24 DIAGNOSIS — L239 Allergic contact dermatitis, unspecified cause: Secondary | ICD-10-CM | POA: Diagnosis not present

## 2021-09-29 DIAGNOSIS — M545 Low back pain, unspecified: Secondary | ICD-10-CM | POA: Diagnosis not present

## 2021-09-29 DIAGNOSIS — M5442 Lumbago with sciatica, left side: Secondary | ICD-10-CM | POA: Diagnosis not present

## 2021-09-29 DIAGNOSIS — M47816 Spondylosis without myelopathy or radiculopathy, lumbar region: Secondary | ICD-10-CM | POA: Diagnosis not present

## 2021-10-05 DIAGNOSIS — M5442 Lumbago with sciatica, left side: Secondary | ICD-10-CM | POA: Diagnosis not present

## 2021-10-05 DIAGNOSIS — Z6825 Body mass index (BMI) 25.0-25.9, adult: Secondary | ICD-10-CM | POA: Diagnosis not present

## 2021-10-15 DIAGNOSIS — L011 Impetiginization of other dermatoses: Secondary | ICD-10-CM | POA: Diagnosis not present

## 2021-10-15 DIAGNOSIS — Q828 Other specified congenital malformations of skin: Secondary | ICD-10-CM | POA: Diagnosis not present

## 2021-10-15 DIAGNOSIS — L2089 Other atopic dermatitis: Secondary | ICD-10-CM | POA: Diagnosis not present

## 2021-11-05 DIAGNOSIS — M5442 Lumbago with sciatica, left side: Secondary | ICD-10-CM | POA: Diagnosis not present

## 2021-11-09 DIAGNOSIS — M5126 Other intervertebral disc displacement, lumbar region: Secondary | ICD-10-CM | POA: Diagnosis not present

## 2021-11-09 DIAGNOSIS — M5416 Radiculopathy, lumbar region: Secondary | ICD-10-CM | POA: Diagnosis not present

## 2021-11-24 DIAGNOSIS — Z13820 Encounter for screening for osteoporosis: Secondary | ICD-10-CM | POA: Diagnosis not present

## 2021-11-24 DIAGNOSIS — M8580 Other specified disorders of bone density and structure, unspecified site: Secondary | ICD-10-CM | POA: Diagnosis not present

## 2021-11-24 DIAGNOSIS — Z01419 Encounter for gynecological examination (general) (routine) without abnormal findings: Secondary | ICD-10-CM | POA: Diagnosis not present

## 2021-11-24 DIAGNOSIS — M5416 Radiculopathy, lumbar region: Secondary | ICD-10-CM | POA: Diagnosis not present

## 2021-11-24 DIAGNOSIS — Z1272 Encounter for screening for malignant neoplasm of vagina: Secondary | ICD-10-CM | POA: Diagnosis not present

## 2021-11-24 DIAGNOSIS — Z6825 Body mass index (BMI) 25.0-25.9, adult: Secondary | ICD-10-CM | POA: Diagnosis not present

## 2021-11-24 DIAGNOSIS — Z1231 Encounter for screening mammogram for malignant neoplasm of breast: Secondary | ICD-10-CM | POA: Diagnosis not present

## 2021-12-16 DIAGNOSIS — M5416 Radiculopathy, lumbar region: Secondary | ICD-10-CM | POA: Diagnosis not present

## 2021-12-31 ENCOUNTER — Encounter: Payer: Self-pay | Admitting: Family Medicine

## 2021-12-31 ENCOUNTER — Other Ambulatory Visit: Payer: Self-pay

## 2021-12-31 ENCOUNTER — Ambulatory Visit (INDEPENDENT_AMBULATORY_CARE_PROVIDER_SITE_OTHER): Payer: BC Managed Care – PPO | Admitting: Family Medicine

## 2021-12-31 DIAGNOSIS — R21 Rash and other nonspecific skin eruption: Secondary | ICD-10-CM

## 2021-12-31 DIAGNOSIS — J018 Other acute sinusitis: Secondary | ICD-10-CM | POA: Diagnosis not present

## 2021-12-31 MED ORDER — AMOXICILLIN-POT CLAVULANATE 875-125 MG PO TABS: 1.0000 | ORAL_TABLET | Freq: Two times a day (BID) | ORAL | 0 refills | Status: DC

## 2021-12-31 MED ORDER — CLOBETASOL PROPIONATE 0.05 % EX OINT
1.0000 | TOPICAL_OINTMENT | Freq: Two times a day (BID) | CUTANEOUS | 0 refills | Status: DC
Start: 2021-12-31 — End: 2022-01-26

## 2021-12-31 NOTE — Patient Instructions (Signed)
Medication as prescribed. ? ?Mild unscented soap. CeraVe daily.  Try the stuff from [facility]. ? ?Take care ? ?Dr. Adriana Simas  ?

## 2021-12-31 NOTE — Telephone Encounter (Signed)
Patient scheduled office visit 12/31/21 at 3:30pm with Dr Adriana Simas ?

## 2022-01-01 DIAGNOSIS — J019 Acute sinusitis, unspecified: Secondary | ICD-10-CM | POA: Insufficient documentation

## 2022-01-01 DIAGNOSIS — R21 Rash and other nonspecific skin eruption: Secondary | ICD-10-CM | POA: Insufficient documentation

## 2022-01-01 NOTE — Progress Notes (Signed)
? ?Subjective:  ?Patient ID: Darlene Galvan, female    DOB: November 13, 1965  Age: 56 y.o. MRN: [id number] ? ?CC: ?Chief Complaint  ?Patient presents with  ? Cough  ?  Sore throat, sinus headache/ pressure, low grade   ? hand skin condition  ? ? ?HPI: ? ?56 year old female presents for evaluation of the above. ? ?Patient has 2 separate issues today.  Patient reports respiratory symptoms for the past few days.  She reports sinus pain and pressure.  Associated cough.  She has had sore throat as well.  Endorses low-grade fever.  Has a history of sinusitis.  Believes that she is experiencing sinusitis.  Additionally, patient has ongoing issues with dry and cracking skin of the distal portion of the digits.  She has seen dermatology twice and has tried numerous over-the-counter emollients and topicals as well as prescription topicals without improvement. ? ?Patient Active Problem List  ? Diagnosis Date Noted  ? Acute sinusitis 01/01/2022  ? Rash 01/01/2022  ? ? ?Social Hx   ?Social History  ? ?Socioeconomic History  ? Marital status: Married  ?  Spouse name: Not on file  ? Number of children: Not on file  ? Years of education: Not on file  ? Highest education level: Not on file  ?Occupational History  ? Not on file  ?Tobacco Use  ? Smoking status: Never  ? Smokeless tobacco: Never  ?Substance and Sexual Activity  ? Alcohol use: Yes  ? Drug use: Never  ? Sexual activity: Not on file  ?Other Topics Concern  ? Not on file  ?Social History Narrative  ? Not on file  ? ?Social Determinants of Health  ? ?Financial Resource Strain: Not on file  ?Food Insecurity: Not on file  ?Transportation Needs: Not on file  ?Physical Activity: Not on file  ?Stress: Not on file  ?Social Connections: Not on file  ? ? ?Review of Systems ?Per HPI ? ?Objective:  ?BP (!) 165/85   Pulse 87   Temp (!) 97.5 ?F (36.4 ?C)   Ht 5\' 3"  (1.6 m)   Wt 143 lb (64.9 kg)   SpO2 97%   BMI 25.33 kg/m?  ? ?BP/Weight 12/31/2021 07/08/2021 03/11/2021  ?Systolic BP 165 153  144  ?Diastolic BP 85 91 86  ?Wt. (Lbs) 143 139.4 133  ?BMI 25.33 24.69 23.56  ? ? ?Physical Exam ?Vitals and nursing note reviewed.  ?Constitutional:   ?   General: She is not in acute distress. ?   Appearance: Normal appearance. She is not ill-appearing.  ?HENT:  ?   Head: Normocephalic and atraumatic.  ?   Right Ear: Tympanic membrane normal.  ?   Left Ear: Tympanic membrane normal.  ?   Nose: Congestion present.  ?   Comments: Frontal sinus and maxillary sinus tenderness. ?   Mouth/Throat:  ?   Pharynx: Oropharynx is clear.  ?Eyes:  ?   General:     ?   Right eye: No discharge.     ?   Left eye: No discharge.  ?   Conjunctiva/sclera: Conjunctivae normal.  ?Cardiovascular:  ?   Rate and Rhythm: Normal rate and regular rhythm.  ?Pulmonary:  ?   Effort: Pulmonary effort is normal.  ?   Breath sounds: Normal breath sounds. No wheezing, rhonchi or rales.  ?Neurological:  ?   Mental Status: She is alert.  ?Psychiatric:     ?   Mood and Affect: Mood normal.     ?  Behavior: Behavior normal.  ? ? ?Lab Results  ?Component Value Date  ? WBC 8.0 10/20/2020  ? HGB 13.3 10/20/2020  ? HCT 40.1 10/20/2020  ? PLT 266 10/20/2020  ? GLUCOSE 98 10/20/2020  ? ALT 22 10/20/2020  ? AST 25 10/20/2020  ? NA 137 10/20/2020  ? K 3.6 10/20/2020  ? CL 103 10/20/2020  ? CREATININE 0.82 10/20/2020  ? BUN 10 10/20/2020  ? CO2 21 (L) 10/20/2020  ? ? ? ?Assessment & Plan:  ? ?Problem List Items Addressed This Visit   ? ?  ? Respiratory  ? Acute sinusitis  ?  Treating with Augmentin. ?  ?  ? Relevant Medications  ? amoxicillin-clavulanate (AUGMENTIN) 875-125 MG tablet  ?  ? Musculoskeletal and Integument  ? Rash  ?  Dry, cracked skin of the digits.  Trial of clobetasol.  Advised to try [employer] No Crack Hand cream. ?  ?  ? ? ?Meds ordered this encounter  ?Medications  ? clobetasol ointment (TEMOVATE) 0.05 %  ?  Sig: Apply 1 application. topically 2 (two) times daily.  ?  Dispense:  30 g  ?  Refill:  0  ? amoxicillin-clavulanate  (AUGMENTIN) 875-125 MG tablet  ?  Sig: Take 1 tablet by mouth 2 (two) times daily.  ?  Dispense:  14 tablet  ?  Refill:  0  ? ? ?Everlene Other DO ?[facility] ? ?

## 2022-01-01 NOTE — Assessment & Plan Note (Signed)
Dry, cracked skin of the digits.  Trial of clobetasol.  Advised to try [facility] No Crack Hand cream. ?

## 2022-01-01 NOTE — Assessment & Plan Note (Signed)
Treating with Augmentin. 

## 2022-01-12 ENCOUNTER — Telehealth: Payer: Self-pay | Admitting: Family Medicine

## 2022-01-12 ENCOUNTER — Other Ambulatory Visit: Payer: Self-pay

## 2022-01-12 MED ORDER — CEFPROZIL 500 MG PO TABS
500.0000 mg | ORAL_TABLET | Freq: Two times a day (BID) | ORAL | 0 refills | Status: DC
Start: 2022-01-12 — End: 2022-01-26

## 2022-01-12 NOTE — Telephone Encounter (Signed)
Pt informed ?Please send in Cefzil 500 mg 1 twice daily for 10 days if any ongoing troubles to notify us thank you ?

## 2022-01-12 NOTE — Telephone Encounter (Signed)
Patient was seen 3/16 with sinus infection and given amoxicillin to take. She states still not any better ,still has cough and green mucus. She is requesting different antibiotic be called into [facility] ?

## 2022-01-12 NOTE — Telephone Encounter (Signed)
Nurses ?Please send in Cefzil 500 mg 1 twice daily for 10 days if any ongoing troubles to notify us thank you ?

## 2022-01-26 ENCOUNTER — Ambulatory Visit (INDEPENDENT_AMBULATORY_CARE_PROVIDER_SITE_OTHER): Payer: BC Managed Care – PPO | Admitting: Nurse Practitioner

## 2022-01-26 ENCOUNTER — Encounter: Payer: Self-pay | Admitting: Nurse Practitioner

## 2022-01-26 VITALS — BP 118/80 | HR 80 | Temp 97.2°F | Wt 141.0 lb

## 2022-01-26 DIAGNOSIS — R5383 Other fatigue: Secondary | ICD-10-CM

## 2022-01-26 DIAGNOSIS — M2550 Pain in unspecified joint: Secondary | ICD-10-CM

## 2022-01-26 DIAGNOSIS — Z13220 Encounter for screening for lipoid disorders: Secondary | ICD-10-CM

## 2022-01-26 DIAGNOSIS — R21 Rash and other nonspecific skin eruption: Secondary | ICD-10-CM | POA: Diagnosis not present

## 2022-01-26 DIAGNOSIS — Z1321 Encounter for screening for nutritional disorder: Secondary | ICD-10-CM | POA: Diagnosis not present

## 2022-01-26 MED ORDER — KETOCONAZOLE 2 % EX CREA
1.0000 | TOPICAL_CREAM | Freq: Two times a day (BID) | CUTANEOUS | 4 refills | Status: DC
Start: 2022-01-26 — End: 2023-07-01

## 2022-01-26 NOTE — Progress Notes (Signed)
? ?Subjective:  ? ? Patient ID: Darlene Galvan, female    DOB: [birth year], 56 y.o.   MRN: [id number] ? ?HPI ? ?56 year old with minimal medical history here for skin condition on both hands (began in October)  and one patch on left foot. Pt has seen 2 dermatologist in [place], seen [place] and has tried multiple OTC products but nothing had helped.  Patient has also tried betamethasone cream, mupirocin cream, clobetasol cream, and oral antibiotics.  Patient states that she has since stopped taking the steroid creams because it seems as if it was making the rash worse.  Pt states areas to her hands are painful, burn, itch, tingle, feels likes she is being pricked by needles. Areas have caused limited movement with thumbs.  ? ?Pt use to wear SNS nails but has recently had them removed.  ? ?Pt is also having lots of joint inflammation. Lower back pain and knee pain along with hand.  Decreased energy even after 9 hours of sleep; with minor activity pt feels as if she needs to take a nap.  Patient also complains of continued fatigue and brain fog over the past couple months. ? ? ?Review of Systems  ?Constitutional:  Positive for fatigue.  ?Musculoskeletal:  Positive for back pain and joint swelling.  ?Skin:  Positive for rash.  ?All other systems reviewed and are negative. ? ?   ?Objective:  ? Physical Exam ?Constitutional:   ?   General: She is not in acute distress. ?   Appearance: Normal appearance. She is normal weight. She is not ill-appearing or toxic-appearing.  ?HENT:  ?   Head: Normocephalic and atraumatic.  ?Cardiovascular:  ?   Rate and Rhythm: Normal rate and regular rhythm.  ?   Pulses: Normal pulses.  ?   Heart sounds: Normal heart sounds. No murmur heard. ?Pulmonary:  ?   Effort: Pulmonary effort is normal. No respiratory distress.  ?   Breath sounds: Normal breath sounds. No wheezing.  ?Musculoskeletal:  ?   Comments: Grossly intact  ?Skin: ?   General: Skin is warm.  ?   Capillary Refill: Capillary  refill takes less than 2 seconds.  ?   Findings: Rash present.  ?   Comments: Bilateral feet appear to be blue to purple in color.  Warm to touch.  Pedal pulses intact.  Cap refill less than 2 seconds.  No swelling noted no tenderness noted. ? ?Red, scaly, fissured rash noted to patient's bilateral forearms and digits.  No discharge noted.  ?Neurological:  ?   Mental Status: She is alert.  ?   Comments: Grossly intact  ?Psychiatric:     ?   Mood and Affect: Mood normal.     ?   Behavior: Behavior normal.  ? ? ? ? ? ?   ?Assessment & Plan:  ? ?1. Arthralgia, unspecified joint ?-Possibly rash and arthralgia linked via autoimmune etiology ?-We will assess inflammatory markers to rule out autoimmune illness. ?- ANA Direct w/Reflex if Positive ?- Sed Rate (ESR) ?- C-reactive protein ?- B12 and Folate Panel ?- Rheumatoid Factor ?-Return in 1 week ? ?2. Fatigue, unspecified type ?-Possible rash and fatigue link to be autoimmune etiology.  However consider anemia or vitamin deficiency as a cause. ?-Will evaluate CBC, thyroid, B12 and folate. ?- B12 and Folate Panel ?- CBC with Differential ?- Thyroid Panel With TSH ?- CMP14+EGFR ?-Return in 1 week ? ?3. Rash of both hands ?-Possibly autoimmune etiology versus fungal etiology. ?-We will  treat with ketoconazole cream twice daily to see if it helps. ?-The fact that her rash was made worse by steroid creams that may hint towards a fungal etiology. ?-However will still investigate autoimmune disorders with lab work. ?- ketoconazole (NIZORAL) 2 % cream; Apply 1 application. topically 2 (two) times daily.  Dispense: 30 g; Refill: 4 ?- ANA Direct w/Reflex if Positive ?- Sed Rate (ESR) ?- C-reactive protein ?- B12 and Folate Panel ?- Rheumatoid Factor ?- CMP14+EGFR ?-Return in 1 week. ? ?4. Lipid screening ?- CMP14+EGFR ?- Lipid Panel With LDL/HDL Ratio ? ?5. Encounter for vitamin deficiency screening ?- Vitamin D (25 hydroxy) ? ?  ?Note:  This document was prepared using Dragon  voice recognition software and may include unintentional dictation errors. ? ? ?

## 2022-01-27 LAB — CMP14+EGFR
ALT: 21 IU/L (ref 0–32)
AST: 27 IU/L (ref 0–40)
Albumin/Globulin Ratio: 1.6 (ref 1.2–2.2)
Albumin: 4.6 g/dL (ref 3.8–4.9)
Alkaline Phosphatase: 81 IU/L (ref 44–121)
BUN/Creatinine Ratio: 14 (ref 9–23)
BUN: 11 mg/dL (ref 6–24)
Bilirubin Total: 0.2 mg/dL (ref 0.0–1.2)
CO2: 24 mmol/L (ref 20–29)
Calcium: 9.5 mg/dL (ref 8.7–10.2)
Chloride: 101 mmol/L (ref 96–106)
Creatinine, Ser: 0.78 mg/dL (ref 0.57–1.00)
Globulin, Total: 2.8 g/dL (ref 1.5–4.5)
Glucose: 76 mg/dL (ref 70–99)
Potassium: 4.3 mmol/L (ref 3.5–5.2)
Sodium: 140 mmol/L (ref 134–144)
Total Protein: 7.4 g/dL (ref 6.0–8.5)
eGFR: 89 mL/min/{1.73_m2} (ref >59)

## 2022-01-27 LAB — CBC WITH DIFFERENTIAL/PLATELET
Basophils Absolute: 0.1 10*3/uL (ref 0.0–0.2)
Basos: 1 %
EOS (ABSOLUTE): 0.1 10*3/uL (ref 0.0–0.4)
Eos: 2 %
Hematocrit: 42.1 % (ref 34.0–46.6)
Hemoglobin: 14.4 g/dL (ref 11.1–15.9)
Immature Grans (Abs): 0 10*3/uL (ref 0.0–0.1)
Immature Granulocytes: 0 %
Lymphocytes Absolute: 2.3 10*3/uL (ref 0.7–3.1)
Lymphs: 36 %
MCH: 33.3 pg — ABNORMAL HIGH (ref 26.6–33.0)
MCHC: 34.2 g/dL (ref 31.5–35.7)
MCV: 98 fL — ABNORMAL HIGH (ref 79–97)
Monocytes Absolute: 0.6 10*3/uL (ref 0.1–0.9)
Monocytes: 9 %
Neutrophils Absolute: 3.2 10*3/uL (ref 1.4–7.0)
Neutrophils: 52 %
Platelets: 299 10*3/uL (ref 150–450)
RBC: 4.32 x10E6/uL (ref 3.77–5.28)
RDW: 11.3 % — ABNORMAL LOW (ref 11.7–15.4)
WBC: 6.3 10*3/uL (ref 3.4–10.8)

## 2022-01-27 LAB — SEDIMENTATION RATE: Sed Rate: 9 mm/hr (ref 0–40)

## 2022-01-27 LAB — RHEUMATOID FACTOR: Rheumatoid fact SerPl-aCnc: <10 IU/mL (ref <14.0)

## 2022-01-27 LAB — C-REACTIVE PROTEIN: CRP: 4 mg/L (ref 0–10)

## 2022-01-27 LAB — THYROID PANEL WITH TSH
Free Thyroxine Index: 2.6 (ref 1.2–4.9)
T3 Uptake Ratio: 28 % (ref 24–39)
T4, Total: 9.2 ug/dL (ref 4.5–12.0)
TSH: 2.16 u[IU]/mL (ref 0.450–4.500)

## 2022-01-27 LAB — B12 AND FOLATE PANEL
Folate: 7.5 ng/mL (ref >3.0)
Vitamin B-12: 658 pg/mL (ref 232–1245)

## 2022-01-27 LAB — LIPID PANEL WITH LDL/HDL RATIO
Cholesterol, Total: 173 mg/dL (ref 100–199)
HDL: 66 mg/dL (ref >39)
LDL Chol Calc (NIH): 88 mg/dL (ref 0–99)
LDL/HDL Ratio: 1.3 ratio (ref 0.0–3.2)
Triglycerides: 109 mg/dL (ref 0–149)
VLDL Cholesterol Cal: 19 mg/dL (ref 5–40)

## 2022-01-27 LAB — ANA W/REFLEX IF POSITIVE: Anti Nuclear Antibody (ANA): NEGATIVE

## 2022-01-27 LAB — VITAMIN D 25 HYDROXY (VIT D DEFICIENCY, FRACTURES): Vit D, 25-Hydroxy: 35.5 ng/mL (ref 30.0–100.0)

## 2022-02-02 ENCOUNTER — Ambulatory Visit (INDEPENDENT_AMBULATORY_CARE_PROVIDER_SITE_OTHER): Payer: BC Managed Care – PPO | Admitting: Nurse Practitioner

## 2022-02-02 ENCOUNTER — Encounter: Payer: Self-pay | Admitting: Nurse Practitioner

## 2022-02-02 VITALS — BP 132/82 | Ht 63.0 in | Wt 139.4 lb

## 2022-02-02 DIAGNOSIS — R21 Rash and other nonspecific skin eruption: Secondary | ICD-10-CM

## 2022-02-02 MED ORDER — PREDNISONE 20 MG PO TABS: 20.0000 mg | ORAL_TABLET | Freq: Every day | ORAL | 0 refills | Status: AC

## 2022-02-02 MED ORDER — LIDOCAINE 5 % EX OINT
1.0000 | TOPICAL_OINTMENT | CUTANEOUS | 0 refills | Status: DC | PRN
Start: 2022-02-02 — End: 2023-07-01

## 2022-02-02 NOTE — Progress Notes (Signed)
? ?  Subjective:  ? ? Patient ID: Darlene Galvan, female    DOB: 04-Jan-1966, 56 y.o.   MRN: [id number] ? ?HPI ? ?56 year old with minimal medical history here for follow-up follow up of skin condition on both hands x6 months (began in October)  and one patch on left foot. Pt has seen 2 dermatologist in [place], seen Dr.Cook and has tried multiple OTC products but nothing had helped.  Patient has also tried betamethasone cream, mupirocin cream, clobetasol cream, and oral antibiotics.  Patient states that she has since stopped taking the steroid creams because it seems as if it was making the rash worse.  Pt states areas to her hands are painful, burn, itch, tingle, feels likes she is being pricked by needles. Areas have caused limited range of motion to bilateral thumbs.  ? ?Patient was prescribed ketoconazole during last visit but states that it is not made a noticeable difference. ? ? ?Review of Systems  ?Skin:   ?     Painful rash to bilateral palms  ?All other systems reviewed and are negative. ? ?   ?Objective:  ? Physical Exam ?Vitals reviewed.  ?Constitutional:   ?   General: She is not in acute distress. ?   Appearance: Normal appearance. She is normal weight. She is not ill-appearing, toxic-appearing or diaphoretic.  ?Cardiovascular:  ?   Rate and Rhythm: Normal rate and regular rhythm.  ?   Pulses: Normal pulses.  ?   Heart sounds: Normal heart sounds. No murmur heard. ?  No friction rub.  ?Pulmonary:  ?   Effort: Pulmonary effort is normal. No respiratory distress.  ?   Breath sounds: No wheezing.  ?Musculoskeletal:  ?   Comments: Grossly intact  ?Skin: ?   General: Skin is warm.  ?   Capillary Refill: Capillary refill takes less than 2 seconds.  ?   Comments: Red, scaly, fissured rash noted to patient's bilateral hands, thumbs, and digits.  No discharge noted.   ?Neurological:  ?   Mental Status: She is alert.  ?   Comments: Grossly intact  ?Psychiatric:     ?   Mood and Affect: Mood normal.     ?    Behavior: Behavior normal.  ? ?   ?Assessment & Plan:  ? ?1. Rash of both hands ?-Trial of ketoconazole cream did not prove to be helpful ?-We will refer patient to dermatology for possible biopsy of the area. ?- Ambulatory referral to Dermatology ?-Lidocaine cream and prednisone tablets to help with symptoms. ?-Side effects of prednisone discussed in detail. ?- lidocaine (XYLOCAINE) 5 % ointment; Apply 1 application. topically as needed.  Dispense: 35.44 g; Refill: 0 ?- predniSONE (DELTASONE) 20 MG tablet; Take 1 tablet (20 mg total) by mouth daily with breakfast for 7 days.  Dispense: 7 tablet; Refill: 0 ?-Return to clinic if symptoms not better or worsen. ?-Notify clinic if you have not heard from dermatology in 1 to 2 weeks. ? ?  ?Note:  This document was prepared using Dragon voice recognition software and may include unintentional dictation errors. ? ? ?

## 2022-02-03 NOTE — Telephone Encounter (Signed)
Faxed in pic of her thumb to dermatology office [phone] ?

## 2022-02-15 DIAGNOSIS — R21 Rash and other nonspecific skin eruption: Secondary | ICD-10-CM | POA: Diagnosis not present

## 2022-04-22 DIAGNOSIS — B009 Herpesviral infection, unspecified: Secondary | ICD-10-CM | POA: Diagnosis not present

## 2022-04-22 DIAGNOSIS — L308 Other specified dermatitis: Secondary | ICD-10-CM | POA: Diagnosis not present

## 2022-05-28 ENCOUNTER — Other Ambulatory Visit: Payer: Self-pay | Admitting: Nurse Practitioner

## 2022-05-28 DIAGNOSIS — R21 Rash and other nonspecific skin eruption: Secondary | ICD-10-CM

## 2022-06-16 DIAGNOSIS — M9902 Segmental and somatic dysfunction of thoracic region: Secondary | ICD-10-CM | POA: Diagnosis not present

## 2022-06-16 DIAGNOSIS — M9905 Segmental and somatic dysfunction of pelvic region: Secondary | ICD-10-CM | POA: Diagnosis not present

## 2022-06-16 DIAGNOSIS — M5441 Lumbago with sciatica, right side: Secondary | ICD-10-CM | POA: Diagnosis not present

## 2022-06-16 DIAGNOSIS — M9903 Segmental and somatic dysfunction of lumbar region: Secondary | ICD-10-CM | POA: Diagnosis not present

## 2022-10-05 DIAGNOSIS — M9901 Segmental and somatic dysfunction of cervical region: Secondary | ICD-10-CM | POA: Diagnosis not present

## 2022-10-05 DIAGNOSIS — M9903 Segmental and somatic dysfunction of lumbar region: Secondary | ICD-10-CM | POA: Diagnosis not present

## 2022-10-05 DIAGNOSIS — M9902 Segmental and somatic dysfunction of thoracic region: Secondary | ICD-10-CM | POA: Diagnosis not present

## 2022-10-05 DIAGNOSIS — M9905 Segmental and somatic dysfunction of pelvic region: Secondary | ICD-10-CM | POA: Diagnosis not present

## 2022-10-13 DIAGNOSIS — M9903 Segmental and somatic dysfunction of lumbar region: Secondary | ICD-10-CM | POA: Diagnosis not present

## 2022-10-13 DIAGNOSIS — M9902 Segmental and somatic dysfunction of thoracic region: Secondary | ICD-10-CM | POA: Diagnosis not present

## 2022-10-13 DIAGNOSIS — M9905 Segmental and somatic dysfunction of pelvic region: Secondary | ICD-10-CM | POA: Diagnosis not present

## 2022-10-13 DIAGNOSIS — M9901 Segmental and somatic dysfunction of cervical region: Secondary | ICD-10-CM | POA: Diagnosis not present

## 2022-10-13 DIAGNOSIS — M9907 Segmental and somatic dysfunction of upper extremity: Secondary | ICD-10-CM | POA: Diagnosis not present

## 2022-10-15 DIAGNOSIS — M9902 Segmental and somatic dysfunction of thoracic region: Secondary | ICD-10-CM | POA: Diagnosis not present

## 2022-10-15 DIAGNOSIS — M9901 Segmental and somatic dysfunction of cervical region: Secondary | ICD-10-CM | POA: Diagnosis not present

## 2022-10-15 DIAGNOSIS — M9905 Segmental and somatic dysfunction of pelvic region: Secondary | ICD-10-CM | POA: Diagnosis not present

## 2022-10-15 DIAGNOSIS — M9907 Segmental and somatic dysfunction of upper extremity: Secondary | ICD-10-CM | POA: Diagnosis not present

## 2022-10-15 DIAGNOSIS — M9903 Segmental and somatic dysfunction of lumbar region: Secondary | ICD-10-CM | POA: Diagnosis not present

## 2022-11-26 DIAGNOSIS — M79644 Pain in right finger(s): Secondary | ICD-10-CM | POA: Diagnosis not present

## 2022-11-26 DIAGNOSIS — M180 Bilateral primary osteoarthritis of first carpometacarpal joints: Secondary | ICD-10-CM | POA: Diagnosis not present

## 2022-11-26 DIAGNOSIS — M1811 Unilateral primary osteoarthritis of first carpometacarpal joint, right hand: Secondary | ICD-10-CM | POA: Diagnosis not present

## 2022-11-26 DIAGNOSIS — M1812 Unilateral primary osteoarthritis of first carpometacarpal joint, left hand: Secondary | ICD-10-CM | POA: Diagnosis not present

## 2022-11-26 DIAGNOSIS — M79645 Pain in left finger(s): Secondary | ICD-10-CM | POA: Diagnosis not present

## 2023-01-06 DIAGNOSIS — L821 Other seborrheic keratosis: Secondary | ICD-10-CM | POA: Diagnosis not present

## 2023-01-06 DIAGNOSIS — R21 Rash and other nonspecific skin eruption: Secondary | ICD-10-CM | POA: Diagnosis not present

## 2023-01-06 DIAGNOSIS — L2081 Atopic neurodermatitis: Secondary | ICD-10-CM | POA: Diagnosis not present

## 2023-01-10 DIAGNOSIS — Z6824 Body mass index (BMI) 24.0-24.9, adult: Secondary | ICD-10-CM | POA: Diagnosis not present

## 2023-01-10 DIAGNOSIS — Z01419 Encounter for gynecological examination (general) (routine) without abnormal findings: Secondary | ICD-10-CM | POA: Diagnosis not present

## 2023-01-10 DIAGNOSIS — Z1231 Encounter for screening mammogram for malignant neoplasm of breast: Secondary | ICD-10-CM | POA: Diagnosis not present

## 2023-01-10 DIAGNOSIS — Z1272 Encounter for screening for malignant neoplasm of vagina: Secondary | ICD-10-CM | POA: Diagnosis not present

## 2023-03-28 IMAGING — MR MR CERVICAL SPINE W/O CM
5 series · 37 of 48 positions shown · non-contrast
Comparison: Head CT 10/20/2020. Brain MRI 10/06/2010

CLINICAL DATA: Tingling of both upper extremities. Tingling of both
arms. Additional history provided by scanning technologist: Patient
reports neck pain radiating down both arms for 3 months.

EXAM:
MRI CERVICAL SPINE WITHOUT CONTRAST
TECHNIQUE: Multiplanar, multisequence MR imaging of the cervical spine was
performed. No intravenous contrast was administered.

[Series 5: T2 · sagittal · 3.0mm · 0.69mm/px · 7 of 15 slices shown (1 of 2)]
[im 1/15]
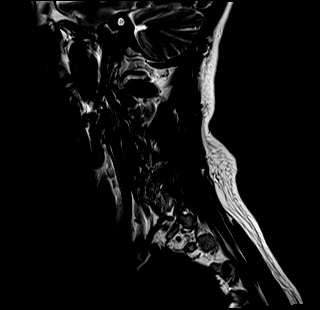
[im 3/15]
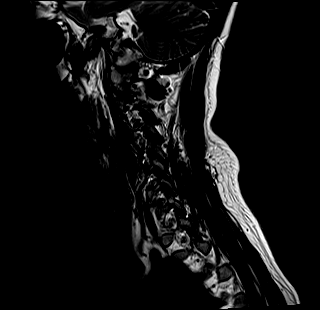
[im 5/15]
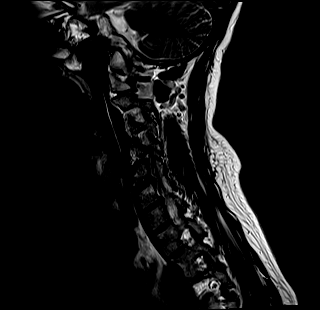
[im 8/15]
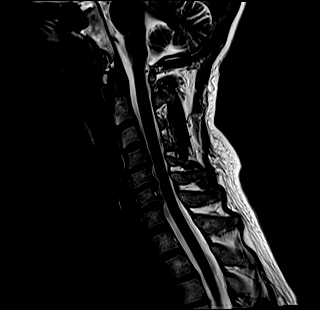
[im 10/15]
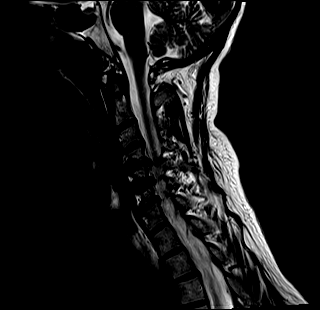
[im 12/15]
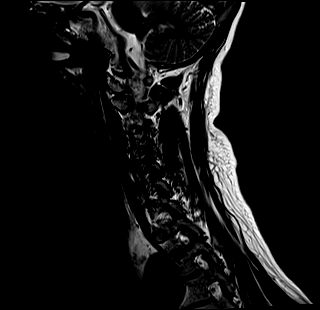
[im 15/15]
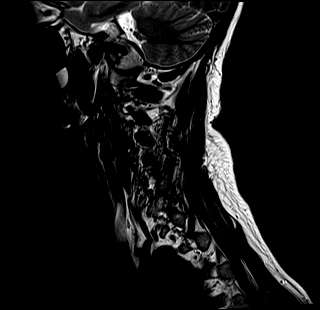

[Series 6: T1 · sagittal · 3.0mm · 0.86mm/px · 7 of 15 slices shown]
[im 1/15]
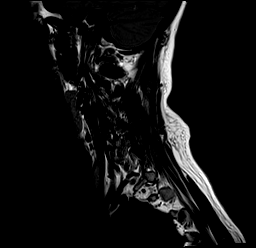
[im 3/15]
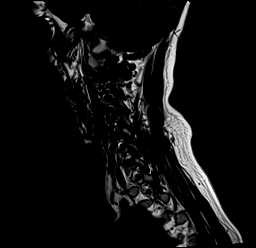
[im 5/15]
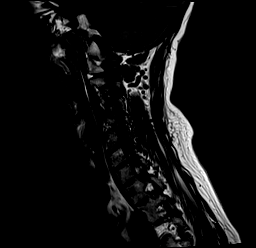
[im 8/15]
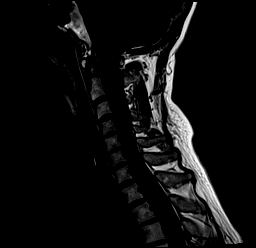
[im 10/15]
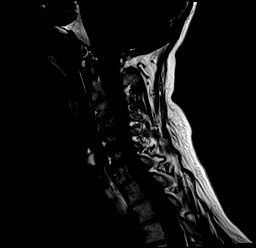
[im 12/15]
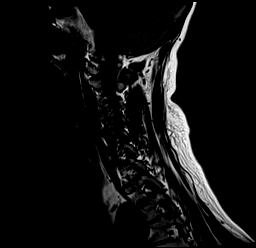
[im 15/15]
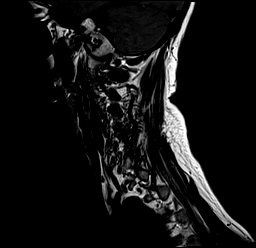

[Series 7: STIR · sagittal · 3.0mm · 0.69mm/px · 7 of 15 slices shown]
[im 1/15]
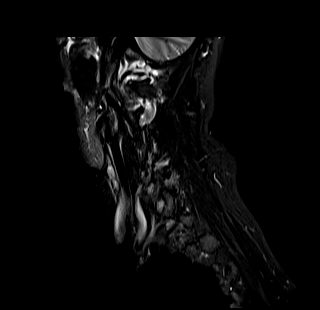
[im 3/15]
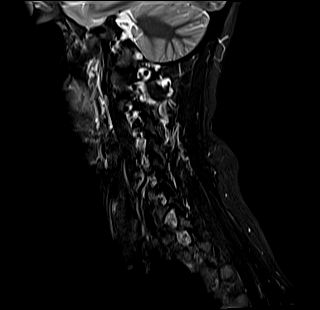
[im 5/15]
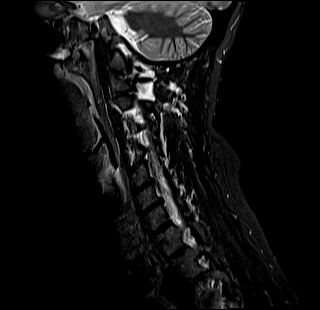
[im 8/15]
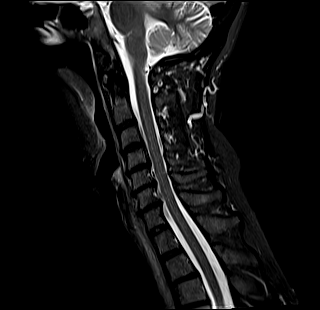
[im 10/15]
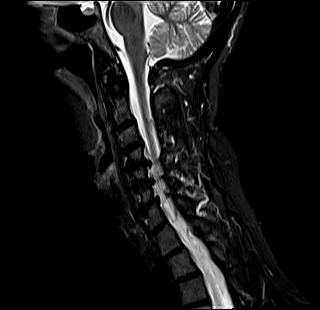
[im 12/15]
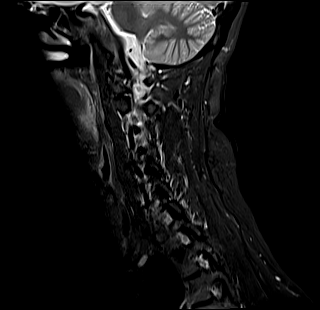
[im 15/15]
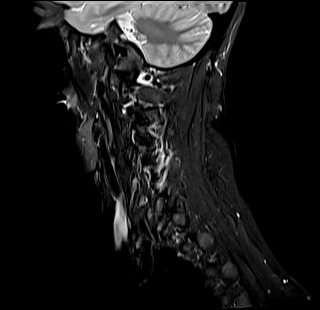

[Series 8: T2 · axial · 3.0mm · 0.70mm/px · z∈[-116,-20]mm · 9 of 29 slices shown (2 of 2)]
[im 1/29]
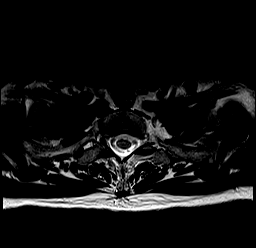
[im 5/29]
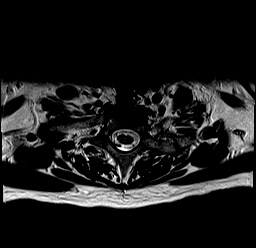
[im 10/29]
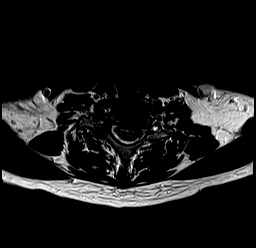
[im 12/29]
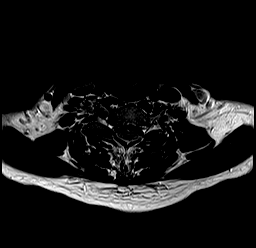
[im 15/29]
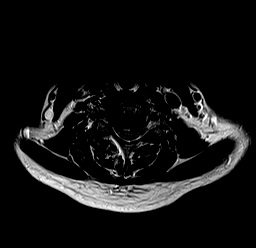
[im 17/29]
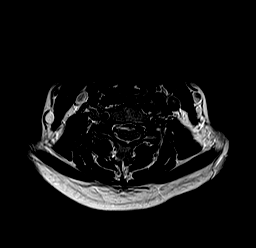
[im 19/29]
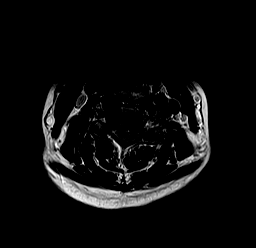
[im 24/29]
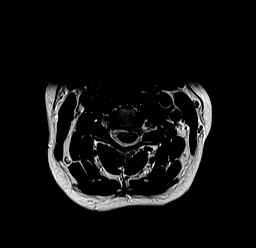
[im 29/29]
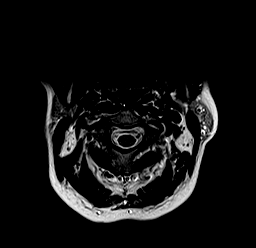

[Series 9: GRE · axial · 3.0mm · 0.35mm/px · z∈[-117,-37]mm · 7 of 30 slices shown]
[im 1/30]
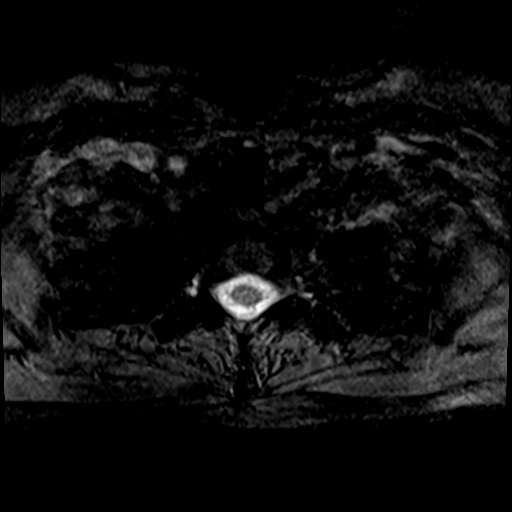
[im 5/30]
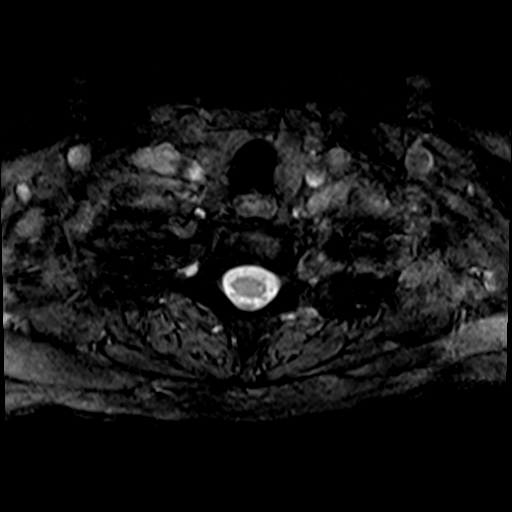
[im 9/30]
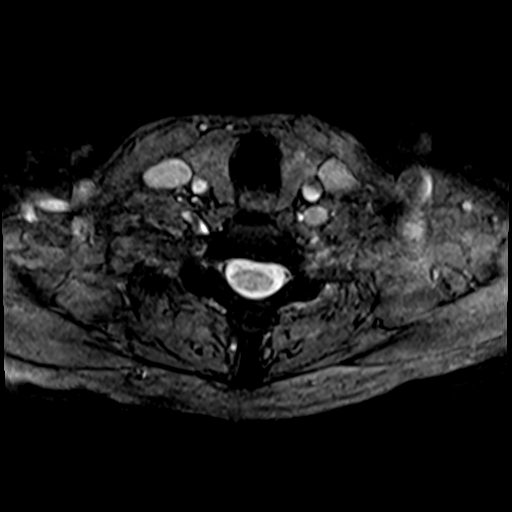
[im 14/30]
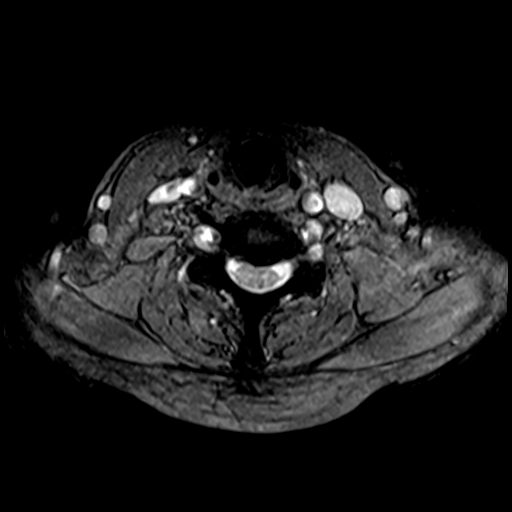
[im 16/30]
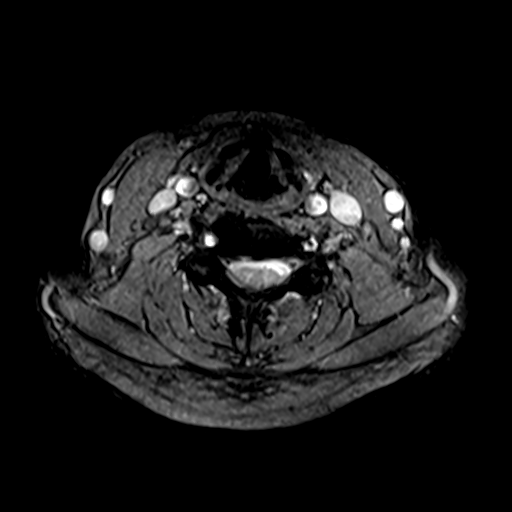
[im 21/30]
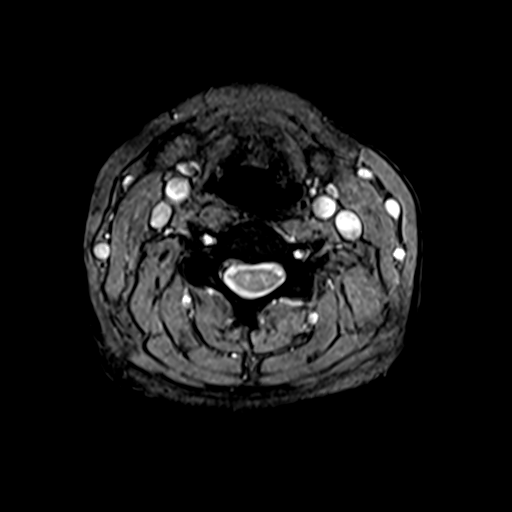
[im 25/30]
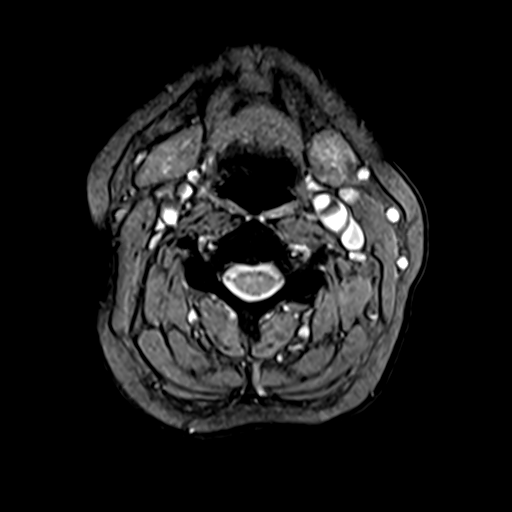

[37 of 48 positions shown; findings below may reference images not displayed]

FINDINGS: Alignment: Straightening of the expected cervical lordosis. Mild
cervical levocurvature. No significant spondylolisthesis.

Vertebrae: Vertebral body height is maintained. No significant
marrow edema or focal suspicious osseous lesion.

Cord: No spinal cord signal abnormality is identified.

Posterior Fossa, vertebral arteries, paraspinal tissues: No
abnormality identified within included portions of the posterior
fossa. Flow voids preserved within the imaged cervical vertebral
arteries. Paraspinal soft tissues unremarkable.

Disc levels:

Moderate multilevel disc degeneration, greatest at C4-C5
(mild/moderate), C5-C6 (moderate) and C6-C7 (mild/moderate).

C2-C3: Shallow disc bulge. No significant spinal canal or foraminal
stenosis.

C3-C4: Shallow disc bulge. Superimposed tiny central disc
protrusion. Uncovertebral hypertrophy (greater on the right). No
significant spinal canal stenosis. Mild right neural foraminal
narrowing.

C4-C5: Disc bulge with right greater than left disc osteophyte
ridge/uncinate hypertrophy. Minimal facet arthrosis. Moderate spinal
canal stenosis with contact upon the dorsal and ventral spinal cord.
Bilateral neural foraminal narrowing (severe right, mild left).

C5-C6: Disc bulge. Right-sided disc osteophyte ridge/uncinate
hypertrophy. Mild uncovertebral hypertrophy also present on the
left. Minimal facet arthrosis. Mild relative spinal canal narrowing
with possible contact upon the ventral spinal cord. Moderate/severe
right neural foraminal narrowing.

C6-C7: Disc bulge with endplate spurring and bilateral disc
osteophyte ridge/uncinate hypertrophy. Mild relative spinal canal
narrowing (without spinal cord mass effect). Bilateral neural
foraminal narrowing (moderate right, mild left).

C7-T1: No significant disc herniation or stenosis.
IMPRESSION: Cervical spondylosis, as outlined and with findings most notably as
follows.

At C4-C5, there is multifactorial moderate spinal canal stenosis
with contact upon the dorsal and ventral spinal cord. Bilateral
neural foraminal narrowing (severe right, mild left).

No more than mild spinal canal narrowing at the remaining levels.

Additional sites of foraminal stenosis, as detailed and greatest on
the right at C5-C6 (moderate/severe) and on the right at C6-C7
(moderate).

Disc degeneration is greatest at C4-C5 (mild/moderate), C5-C6
(moderate) and C6-C7 (mild/moderate).

Straightening of the expected cervical lordosis.

Mild cervical levocurvature.

## 2023-07-01 ENCOUNTER — Ambulatory Visit (INDEPENDENT_AMBULATORY_CARE_PROVIDER_SITE_OTHER): Payer: BC Managed Care – PPO | Admitting: Nurse Practitioner

## 2023-07-01 VITALS — BP 116/78 | HR 71 | Temp 98.1°F | Ht 63.0 in | Wt 140.4 lb

## 2023-07-01 DIAGNOSIS — R1032 Left lower quadrant pain: Secondary | ICD-10-CM

## 2023-07-01 DIAGNOSIS — R5383 Other fatigue: Secondary | ICD-10-CM

## 2023-07-01 DIAGNOSIS — K921 Melena: Secondary | ICD-10-CM

## 2023-07-01 DIAGNOSIS — K219 Gastro-esophageal reflux disease without esophagitis: Secondary | ICD-10-CM

## 2023-07-01 DIAGNOSIS — K582 Mixed irritable bowel syndrome: Secondary | ICD-10-CM

## 2023-07-01 MED ORDER — PANTOPRAZOLE SODIUM 40 MG PO TBEC: 40.0000 mg | DELAYED_RELEASE_TABLET | Freq: Every day | ORAL | 0 refills | Status: DC

## 2023-07-01 NOTE — Progress Notes (Unsigned)
Subjective:    Patient ID: Darlene Galvan, female    DOB: 10-15-1966, 57 y.o.   MRN: [id number]  HPI Pt comes in today for abdominal pain that has been happening for approx 2 weeks. Pt also states she has had diarrhea that is inconsistent, not going for a couple days been when she does it is diarrhea, foul smell  and black tar looking.. Pt states abdominal pain that radiates lower left side that travels to the back.  Presents for complaints of alternating cycles of diarrhea and constipation for the past 3 weeks.  4 days ago had an episode of black stool with mucus and a foul smell x 2.  None since.  No fevers.  Diet is the same.  Has not identified any specific triggers.  Also having left lower quadrant abdominal pain, constant ache with occasional sharp pain that will radiate into the left lower back area.  Will occasionally radiate across the lower abdomen as well.  Fatigue.  No family history of GI cancers.  Denies any tobacco use or vaping.  Has had a hysterectomy with BSO.  Up until 3 weeks ago patient was drinking approximately 1 bottle of wine per night but has stopped this.  States this was her way of dealing with stress and major life changes.  States she is done well on bupropion and alprazolam.  PMH includes GERD.    07/01/2023   10:31 AM  Depression screen PHQ 2/9  Decreased Interest 2  Down, Depressed, Hopeless 2  PHQ - 2 Score 4  Altered sleeping 2  Tired, decreased energy 1  Change in appetite 0  Feeling bad or failure about yourself  0  Trouble concentrating 0  Moving slowly or fidgety/restless 0  Suicidal thoughts 0  PHQ-9 Score 7  Difficult doing work/chores Not difficult at all      07/01/2023   10:31 AM  GAD 7 : Generalized Anxiety Score  Nervous, Anxious, on Edge 2  Control/stop worrying 2  Worry too much - different things 2  Trouble relaxing 0  Restless 0  Easily annoyed or irritable 2  Afraid - awful might happen 2  Total GAD 7 Score 10  Anxiety Difficulty  Not difficult at all    Review of Systems  Constitutional:  Positive for fatigue. Negative for fever.  HENT:  Negative for sore throat and trouble swallowing.   Respiratory:  Negative for cough, chest tightness and shortness of breath.   Cardiovascular:  Negative for chest pain.  Gastrointestinal:  Positive for abdominal pain, blood in stool, constipation and diarrhea. Negative for nausea and vomiting.  Psychiatric/Behavioral:  Positive for dysphoric mood and sleep disturbance. Negative for suicidal ideas. The patient is nervous/anxious.        Objective:   Physical Exam NAD.  Alert, oriented.  Calm affect.  Making good eye contact.  Dressed appropriately for the weather.  Speech clear.  Thoughts logical coherent and relevant.  Thyroid nontender to palpation, no mass or goiter noted.  Lungs clear.  Heart regular rate rhythm.  Abdomen soft mildly distended with active bowel sounds x 4.  Mild tenderness in the outer left lower quadrant.  No obvious masses.  No rebound or guarding.  Also some mild tenderness in the upper epigastric area. Today's Vitals   07/01/23 1023  BP: 116/78  Pulse: 71  Temp: 98.1 F (36.7 C)  SpO2: 97%  Weight: 140 lb 6.4 oz (63.7 kg)  Height: 5\' 3"  (1.6 m)  Body mass index is 24.87 kg/m.       Assessment & Plan:   Problem List Items Addressed This Visit       Digestive   Gastroesophageal reflux disease without esophagitis   Relevant Medications   pantoprazole (PROTONIX) 40 MG tablet   Other Relevant Orders   CBC with Differential/Platelet (Completed)   Lipase (Completed)   Irritable bowel syndrome with both constipation and diarrhea   Relevant Medications   pantoprazole (PROTONIX) 40 MG tablet   Melena   Relevant Orders   CBC with Differential/Platelet (Completed)     Other   LLQ abdominal pain - Primary   Relevant Orders   CBC with Differential/Platelet (Completed)   Comprehensive metabolic panel (Completed)   Lipase (Completed)   CT  ABDOMEN W CONTRAST   Other Visit Diagnoses     Fatigue, unspecified type       Relevant Orders   CBC with Differential/Platelet (Completed)   Comprehensive metabolic panel (Completed)   TSH (Completed)      Meds ordered this encounter  Medications   pantoprazole (PROTONIX) 40 MG tablet    Sig: Take 1 tablet (40 mg total) by mouth daily. Prn acid reflux    Dispense:  90 tablet    Refill:  0    Order Specific Question:   Supervising Provider    Answer:   Lilyan Punt A [[id number]]   Labs pending. Start pantoprazole as directed for acid reflux. Discussed diagnosis of probable IBS. Encourage patient to avoid excessive alcohol consumption.  Discussed the importance of stress reduction.  Defers other meds for anxiety and depression at this time.  Continue current medication regimen as directed.  Contact office if she would like to add another medication to her regimen. CT scan of the abdomen with contrast pending. Warning signs reviewed.  Patient to contact office or go to ED if any new or worsening symptoms.  Further follow-up based on test results.

## 2023-07-02 ENCOUNTER — Encounter: Payer: Self-pay | Admitting: Nurse Practitioner

## 2023-07-02 DIAGNOSIS — R1032 Left lower quadrant pain: Secondary | ICD-10-CM | POA: Insufficient documentation

## 2023-07-02 DIAGNOSIS — K921 Melena: Secondary | ICD-10-CM | POA: Insufficient documentation

## 2023-07-02 DIAGNOSIS — K219 Gastro-esophageal reflux disease without esophagitis: Secondary | ICD-10-CM | POA: Insufficient documentation

## 2023-07-02 DIAGNOSIS — K582 Mixed irritable bowel syndrome: Secondary | ICD-10-CM | POA: Insufficient documentation

## 2023-07-02 LAB — COMPREHENSIVE METABOLIC PANEL
ALT: 17 IU/L (ref 0–32)
AST: 23 IU/L (ref 0–40)
Albumin: 4 g/dL (ref 3.8–4.9)
Alkaline Phosphatase: 67 IU/L (ref 44–121)
BUN/Creatinine Ratio: 15 (ref 9–23)
BUN: 12 mg/dL (ref 6–24)
Bilirubin Total: 0.3 mg/dL (ref 0.0–1.2)
CO2: 23 mmol/L (ref 20–29)
Calcium: 9 mg/dL (ref 8.7–10.2)
Chloride: 102 mmol/L (ref 96–106)
Creatinine, Ser: 0.8 mg/dL (ref 0.57–1.00)
Globulin, Total: 2.7 g/dL (ref 1.5–4.5)
Glucose: 85 mg/dL (ref 70–99)
Potassium: 4.3 mmol/L (ref 3.5–5.2)
Sodium: 137 mmol/L (ref 134–144)
Total Protein: 6.7 g/dL (ref 6.0–8.5)
eGFR: 85 mL/min/{1.73_m2} (ref >59)

## 2023-07-02 LAB — CBC WITH DIFFERENTIAL/PLATELET
Basophils Absolute: 0 10*3/uL (ref 0.0–0.2)
Basos: 1 %
EOS (ABSOLUTE): 0.1 10*3/uL (ref 0.0–0.4)
Eos: 3 %
Hematocrit: 40.6 % (ref 34.0–46.6)
Hemoglobin: 13.8 g/dL (ref 11.1–15.9)
Immature Grans (Abs): 0 10*3/uL (ref 0.0–0.1)
Immature Granulocytes: 0 %
Lymphocytes Absolute: 1.7 10*3/uL (ref 0.7–3.1)
Lymphs: 34 %
MCH: 34.7 pg — ABNORMAL HIGH (ref 26.6–33.0)
MCHC: 34 g/dL (ref 31.5–35.7)
MCV: 102 fL — ABNORMAL HIGH (ref 79–97)
Monocytes Absolute: 0.4 10*3/uL (ref 0.1–0.9)
Monocytes: 9 %
Neutrophils Absolute: 2.7 10*3/uL (ref 1.4–7.0)
Neutrophils: 53 %
Platelets: 259 10*3/uL (ref 150–450)
RBC: 3.98 x10E6/uL (ref 3.77–5.28)
RDW: 11.6 % — ABNORMAL LOW (ref 11.7–15.4)
WBC: 5.1 10*3/uL (ref 3.4–10.8)

## 2023-07-02 LAB — LIPASE: Lipase: 29 U/L (ref 14–72)

## 2023-07-02 LAB — TSH: TSH: 3.02 u[IU]/mL (ref 0.450–4.500)

## 2023-07-07 ENCOUNTER — Telehealth: Payer: Self-pay

## 2023-07-07 ENCOUNTER — Other Ambulatory Visit: Payer: Self-pay

## 2023-07-07 DIAGNOSIS — R1032 Left lower quadrant pain: Secondary | ICD-10-CM

## 2023-07-07 DIAGNOSIS — R109 Unspecified abdominal pain: Secondary | ICD-10-CM

## 2023-07-07 NOTE — Telephone Encounter (Signed)
Ct has been reordered as Abdomen Pelvis W Contrast

## 2023-07-07 NOTE — Telephone Encounter (Signed)
Paige from [facility] called and explained that the CT of the abdomin will probably need to be re ordered for abdomin and pelvis  due to the reasoning for the CT. Idalia Needle states the abdomin image will not show below the belly button. Need to confirm which needed to be ordered, CT has been canceled until further notice. Please advise.

## 2023-07-08 ENCOUNTER — Ambulatory Visit (HOSPITAL_COMMUNITY)
Admission: RE | Admit: 2023-07-08 | Discharge: 2023-07-08 | Disposition: A | Discharge status: Home / Self Care | Payer: BC Managed Care – PPO | Source: Ambulatory Visit | Attending: Nurse Practitioner | Admitting: Nurse Practitioner

## 2023-07-08 DIAGNOSIS — R1032 Left lower quadrant pain: Secondary | ICD-10-CM | POA: Diagnosis not present

## 2023-07-08 DIAGNOSIS — R140 Abdominal distension (gaseous): Secondary | ICD-10-CM | POA: Diagnosis not present

## 2023-07-08 DIAGNOSIS — K5730 Diverticulosis of large intestine without perforation or abscess without bleeding: Secondary | ICD-10-CM | POA: Diagnosis not present

## 2023-07-08 DIAGNOSIS — R109 Unspecified abdominal pain: Secondary | ICD-10-CM | POA: Insufficient documentation

## 2023-07-08 MED ORDER — IOHEXOL 300 MG/ML  SOLN
100.0000 mL | Freq: Once | INTRAMUSCULAR | Status: AC | PRN
  Administered 2023-07-08: 100 mL via INTRAVENOUS

## 2023-07-12 ENCOUNTER — Encounter: Payer: Self-pay | Admitting: *Deleted

## 2023-07-20 ENCOUNTER — Encounter: Payer: Self-pay | Admitting: Nurse Practitioner

## 2023-12-26 DIAGNOSIS — M180 Bilateral primary osteoarthritis of first carpometacarpal joints: Secondary | ICD-10-CM | POA: Diagnosis not present

## 2023-12-26 DIAGNOSIS — M65321 Trigger finger, right index finger: Secondary | ICD-10-CM | POA: Diagnosis not present

## 2024-01-12 DIAGNOSIS — Z13820 Encounter for screening for osteoporosis: Secondary | ICD-10-CM | POA: Diagnosis not present

## 2024-01-12 DIAGNOSIS — Z6825 Body mass index (BMI) 25.0-25.9, adult: Secondary | ICD-10-CM | POA: Diagnosis not present

## 2024-01-12 DIAGNOSIS — Z1231 Encounter for screening mammogram for malignant neoplasm of breast: Secondary | ICD-10-CM | POA: Diagnosis not present

## 2024-01-12 DIAGNOSIS — Z01419 Encounter for gynecological examination (general) (routine) without abnormal findings: Secondary | ICD-10-CM | POA: Diagnosis not present

## 2024-01-26 ENCOUNTER — Ambulatory Visit (INDEPENDENT_AMBULATORY_CARE_PROVIDER_SITE_OTHER): Payer: Self-pay | Admitting: Nurse Practitioner

## 2024-01-26 ENCOUNTER — Ambulatory Visit (HOSPITAL_COMMUNITY)
Admission: RE | Admit: 2024-01-26 | Discharge: 2024-01-26 | Disposition: A | Discharge status: Home / Self Care | Source: Ambulatory Visit | Attending: Nurse Practitioner | Admitting: Nurse Practitioner

## 2024-01-26 ENCOUNTER — Encounter: Payer: Self-pay | Admitting: Nurse Practitioner

## 2024-01-26 DIAGNOSIS — S39012A Strain of muscle, fascia and tendon of lower back, initial encounter: Secondary | ICD-10-CM | POA: Diagnosis not present

## 2024-01-26 DIAGNOSIS — S161XXA Strain of muscle, fascia and tendon at neck level, initial encounter: Secondary | ICD-10-CM

## 2024-01-26 DIAGNOSIS — S29012A Strain of muscle and tendon of back wall of thorax, initial encounter: Secondary | ICD-10-CM | POA: Insufficient documentation

## 2024-01-26 DIAGNOSIS — M47812 Spondylosis without myelopathy or radiculopathy, cervical region: Secondary | ICD-10-CM | POA: Diagnosis not present

## 2024-01-26 DIAGNOSIS — M25511 Pain in right shoulder: Secondary | ICD-10-CM | POA: Diagnosis not present

## 2024-01-26 DIAGNOSIS — G44209 Tension-type headache, unspecified, not intractable: Secondary | ICD-10-CM

## 2024-01-26 DIAGNOSIS — M542 Cervicalgia: Secondary | ICD-10-CM | POA: Diagnosis not present

## 2024-01-26 MED ORDER — METHOCARBAMOL 500 MG PO TABS: ORAL_TABLET | ORAL | 0 refills | Status: DC

## 2024-01-26 MED ORDER — MELOXICAM 15 MG PO TABS
15.0000 mg | ORAL_TABLET | Freq: Every day | ORAL | 0 refills | Status: DC
Start: 2024-01-26 — End: 2024-04-23
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
## 2024-01-26 NOTE — Patient Instructions (Signed)
 Neck Exercises Ask your health care provider which exercises are safe for you. Do exercises exactly as told by your health care provider and adjust them as directed. It is normal to feel mild stretching, pulling, tightness, or discomfort as you do these exercises. Stop right away if you feel sudden pain or your pain gets worse. Do not begin these exercises until told by your health care provider. Neck exercises can be important for many reasons. They can improve strength and maintain flexibility in your neck, which will help your upper back and prevent neck pain. Stretching exercises Rotation neck stretching  Sit in a chair or stand up. Place your feet flat on the floor, shoulder-width apart. Slowly turn your head (rotate) to the right until a slight stretch is felt. Turn it all the way to the right so you can look over your right shoulder. Do not tilt or tip your head. Hold this position for 10-30 seconds. Slowly turn your head (rotate) to the left until a slight stretch is felt. Turn it all the way to the left so you can look over your left shoulder. Do not tilt or tip your head. Hold this position for 10-30 seconds. Repeat __________ times. Complete this exercise __________ times a day. Neck retraction  Sit in a sturdy chair or stand up. Look straight ahead. Do not bend your neck. Use your fingers to push your chin backward (retraction). Do not bend your neck for this movement. Continue to face straight ahead. If you are doing the exercise properly, you will feel a slight sensation in your throat and a stretch at the back of your neck. Hold the stretch for 1-2 seconds. Repeat __________ times. Complete this exercise __________ times a day. Strengthening exercises Neck press  Lie on your back on a firm bed or on the floor with a pillow under your head. Use your neck muscles to push your head down on the pillow and straighten your spine. Hold the position as well as you can. Keep your head  facing up (in a neutral position) and your chin tucked. Slowly count to 5 while holding this position. Repeat __________ times. Complete this exercise __________ times a day. Isometrics These are exercises in which you strengthen the muscles in your neck while keeping your neck still (isometrics). Sit in a supportive chair and place your hand on your forehead. Keep your head and face facing straight ahead. Do not flex or extend your neck while doing isometrics. Push forward with your head and neck while pushing back with your hand. Hold for 10 seconds. Do the sequence again, this time putting your hand against the back of your head. Use your head and neck to push backward against the hand pressure. Finally, do the same exercise on either side of your head, pushing sideways against the pressure of your hand. Repeat __________ times. Complete this exercise __________ times a day. Prone head lifts  Lie face-down (prone position), resting on your elbows so that your chest and upper back are raised. Start with your head facing downward, near your chest. Position your chin either on or near your chest. Slowly lift your head upward. Lift until you are looking straight ahead. Then continue lifting your head as far back as you can comfortably stretch. Hold your head up for 5 seconds. Then slowly lower it to your starting position. Repeat __________ times. Complete this exercise __________ times a day. Supine head lifts  Lie on your back (supine position), bending your knees  to point to the ceiling and keeping your feet flat on the floor. Lift your head slowly off the floor, raising your chin toward your chest. Hold for 5 seconds. Repeat __________ times. Complete this exercise __________ times a day. Scapular retraction  Stand with your arms at your sides. Look straight ahead. Slowly pull both shoulders (scapulae) backward and downward (retraction) until you feel a stretch between your shoulder  blades in your upper back. Hold for 10-30 seconds. Relax and repeat. Repeat __________ times. Complete this exercise __________ times a day. Contact a health care provider if: Your neck pain or discomfort gets worse when you do an exercise. Your neck pain or discomfort does not improve within 2 hours after you exercise. If you have any of these problems, stop exercising right away. Do not do the exercises again unless your health care provider says that you can. Get help right away if: You develop sudden, severe neck pain. If this happens, stop exercising right away. Do not do the exercises again unless your health care provider says that you can. This information is not intended to replace advice given to you by your health care provider. Make sure you discuss any questions you have with your health care provider. Document Revised: 03/31/2021 Document Reviewed: 03/31/2021 Elsevier Patient Education  2024 [employer].

## 2024-01-27 ENCOUNTER — Encounter: Payer: Self-pay | Admitting: Nurse Practitioner

## 2024-01-27 DIAGNOSIS — S161XXA Strain of muscle, fascia and tendon at neck level, initial encounter: Secondary | ICD-10-CM | POA: Insufficient documentation

## 2024-01-27 DIAGNOSIS — S29012A Strain of muscle and tendon of back wall of thorax, initial encounter: Secondary | ICD-10-CM | POA: Insufficient documentation

## 2024-01-27 DIAGNOSIS — G44209 Tension-type headache, unspecified, not intractable: Secondary | ICD-10-CM | POA: Insufficient documentation

## 2024-01-30 ENCOUNTER — Encounter: Payer: Self-pay | Admitting: Nurse Practitioner

## 2024-02-08 ENCOUNTER — Encounter: Payer: Self-pay | Admitting: Nurse Practitioner

## 2024-02-28 DIAGNOSIS — M65321 Trigger finger, right index finger: Secondary | ICD-10-CM | POA: Diagnosis not present

## 2024-02-28 DIAGNOSIS — M1812 Unilateral primary osteoarthritis of first carpometacarpal joint, left hand: Secondary | ICD-10-CM | POA: Diagnosis not present

## 2024-02-28 DIAGNOSIS — M1811 Unilateral primary osteoarthritis of first carpometacarpal joint, right hand: Secondary | ICD-10-CM | POA: Diagnosis not present

## 2024-02-28 DIAGNOSIS — M180 Bilateral primary osteoarthritis of first carpometacarpal joints: Secondary | ICD-10-CM | POA: Diagnosis not present

## 2024-04-23 ENCOUNTER — Telehealth: Payer: Self-pay

## 2024-04-23 MED ORDER — MELOXICAM 15 MG PO TABS: 15.0000 mg | ORAL_TABLET | Freq: Every day | ORAL | 0 refills | Status: DC

## 2024-04-23 NOTE — Telephone Encounter (Signed)
 Prescription Request  04/23/2024  LOV: Visit date not found  What is the name of the medication or equipment? meloxicam  (MOBIC ) 15 MG tablet   Have you contacted your pharmacy to request a refill? Yes   Which pharmacy would you like this sent to?  [facility] [phone] - [place] - [address] AT [facility] [address] [place] [address] Phone: [phone] Fax: [phone]    Patient notified that their request is being sent to the clinical staff for review and that they should receive a response within 2 business days.   Please advise at Mobile [phone] (mobile)

## 2024-04-25 ENCOUNTER — Ambulatory Visit (INDEPENDENT_AMBULATORY_CARE_PROVIDER_SITE_OTHER): Admitting: Physician Assistant

## 2024-04-25 ENCOUNTER — Ambulatory Visit: Payer: Self-pay | Admitting: *Deleted

## 2024-04-25 ENCOUNTER — Encounter: Payer: Self-pay | Admitting: Physician Assistant

## 2024-04-25 VITALS — BP 137/80 | HR 59 | Temp 97.2°F | Ht 63.0 in | Wt 141.0 lb

## 2024-04-25 DIAGNOSIS — K5792 Diverticulitis of intestine, part unspecified, without perforation or abscess without bleeding: Secondary | ICD-10-CM

## 2024-04-25 DIAGNOSIS — K5790 Diverticulosis of intestine, part unspecified, without perforation or abscess without bleeding: Secondary | ICD-10-CM | POA: Insufficient documentation

## 2024-04-25 MED ORDER — AMOXICILLIN-POT CLAVULANATE 875-125 MG PO TABS
1.0000 | ORAL_TABLET | Freq: Two times a day (BID) | ORAL | 0 refills | Status: AC
Start: 2024-04-25 — End: 2024-05-02

## 2024-04-25 NOTE — Telephone Encounter (Signed)
  Patient c/o abdominal pain. Severe at times prior to having BM, severe crampy suddenly and goes away after BM.  Started 2 weeks ago  Happens 30 minutes after meals Not sleeping Diarrheal stools, black tarry, floating on water and noted mucus.  Tolerated liquids  Reports dizziness at times Requesting appt and refused ED due to long wait times.  Please advise if OV can be scheduled.       FYI Only or Action Required?: Action required by provider: request for appointment and clinical question for provider.  Patient was last seen in primary care on 01/26/2024 by Mauro Elveria BROCKS, NP.  Called Nurse Triage reporting Abdominal Pain.  Symptoms began several weeks ago.  Interventions attempted: Rest, hydration, or home remedies.  Symptoms are: gradually worsening.  Triage Disposition: Go to ED Now (Notify PCP)  Patient/caregiver understands and will follow disposition?: No, wishes to speak with PCP     Copied from CRM [id number]. Topic: Clinical - Red Word Triage >> Apr 25, 2024  9:57 AM Selinda RAMAN wrote: Red Word that prompted transfer to Nurse Triage: The patient called in stating she has had intestinal issues for over 2 weeks now. She complains of constant cramping on the left side and diarrhea. She says even with incorporated dietary changes she has been having no relief. I will transfer her to E2C2 NT. Reason for Disposition  Black or tarry bowel movements  (Exception: Chronic-unchanged black-grey BMs AND is taking iron pills or Pepto-Bismol.)  Protocols used: Abdominal Pain - Female-A-AH

## 2024-04-25 NOTE — Progress Notes (Signed)
 Acute Office Visit  Subjective:     Patient ID: Darlene Galvan, female    DOB: Mar 04, 1966, 58 y.o.   MRN: [id number]   Patient presents today with complaints of left lower quadrant pain and diarrhea for 10 days. She states dark stools with occasional mucus. Abdominal pain with associated cramping before BM. She relates 3-6 episodes of diarrhea per day. Symptoms have not improved since onset. Has attempted to modify diet, eat less, or avoid irritative foods. Had similar symptoms back in September. Has not tried OTC medications for relief.      Review of Systems  Constitutional:  Positive for malaise/fatigue. Negative for fever and weight loss.  Respiratory:  Negative for shortness of breath.   Cardiovascular:  Negative for chest pain.  Gastrointestinal:  Positive for abdominal pain, diarrhea and melena. Negative for blood in stool, constipation, nausea and vomiting.  Musculoskeletal:  Negative for myalgias.  Neurological:  Negative for headaches.        Objective:     BP 137/80   Pulse (!) 59   Temp (!) 97.2 F (36.2 C)   Ht 5' 3 (1.6 m)   Wt 141 lb (64 kg)   SpO2 99%   BMI 24.98 kg/m   Physical Exam Constitutional:      Appearance: Normal appearance. She is normal weight.  HENT:     Head: Normocephalic and atraumatic.     Mouth/Throat:     Mouth: Mucous membranes are moist.     Pharynx: Oropharynx is clear.  Eyes:     Extraocular Movements: Extraocular movements intact.     Conjunctiva/sclera: Conjunctivae normal.  Cardiovascular:     Rate and Rhythm: Normal rate and regular rhythm.     Heart sounds: Normal heart sounds. No murmur heard. Pulmonary:     Effort: Pulmonary effort is normal.     Breath sounds: Normal breath sounds. No wheezing or rales.  Abdominal:     General: Abdomen is flat. Bowel sounds are normal. There is no distension.     Palpations: Abdomen is soft.     Tenderness: There is abdominal tenderness in the left lower quadrant. There is no  guarding or rebound.  Skin:    General: Skin is warm and dry.  Neurological:     General: No focal deficit present.     Mental Status: She is alert and oriented to person, place, and time.  Psychiatric:        Mood and Affect: Mood normal.        Behavior: Behavior normal.     No results found for any visits on 04/25/24.      Assessment & Plan:  Diverticulitis Assessment & Plan: Patient presents today with LLQ pain and diarrhea x 10 days. Patient with similar symptoms back in September. CT scan showed diverticulosis at that time. Significant left sided abdominal pain on exam today. No rebound tenderness, no distension, normal bowel sounds. Will get labs to rule out infection or electrolyte disturbances. Will cover with Augmentin  today. Discussed liquid and bland diet today, advance diet as tolerated. Warning signs and ER precautions discussed.   Orders: -     CBC with Differential/Platelet -     CMP14+EGFR -     Amoxicillin -Pot Clavulanate; Take 1 tablet by mouth 2 (two) times daily for 7 days.  Dispense: 14 tablet; Refill: 0 -     Ambulatory referral to Gastroenterology   Return if symptoms worsen or fail to improve.  Charmaine Korryn Pancoast, PA-C

## 2024-04-25 NOTE — Assessment & Plan Note (Signed)
 Patient presents today with LLQ pain and diarrhea x 10 days. Patient with similar symptoms back in September. CT scan showed diverticulosis at that time. Significant left sided abdominal pain on exam today. No rebound tenderness, no distension, normal bowel sounds. Will get labs to rule out infection or electrolyte disturbances. Will cover with Augmentin  today. Discussed liquid and bland diet today, advance diet as tolerated. Warning signs and ER precautions discussed.

## 2024-04-26 ENCOUNTER — Ambulatory Visit: Payer: Self-pay | Admitting: Physician Assistant

## 2024-04-26 LAB — CBC WITH DIFFERENTIAL/PLATELET
Basophils Absolute: 0 x10E3/uL (ref 0.0–0.2)
Basos: 0 %
EOS (ABSOLUTE): 0 x10E3/uL (ref 0.0–0.4)
Eos: 0 %
Hematocrit: 39.4 % (ref 34.0–46.6)
Hemoglobin: 13.1 g/dL (ref 11.1–15.9)
Immature Grans (Abs): 0 x10E3/uL (ref 0.0–0.1)
Immature Granulocytes: 0 %
Lymphocytes Absolute: 2.1 x10E3/uL (ref 0.7–3.1)
Lymphs: 35 %
MCH: 34.3 pg — ABNORMAL HIGH (ref 26.6–33.0)
MCHC: 33.2 g/dL (ref 31.5–35.7)
MCV: 103 fL — ABNORMAL HIGH (ref 79–97)
Monocytes Absolute: 0.7 x10E3/uL (ref 0.1–0.9)
Monocytes: 12 %
Neutrophils Absolute: 3.2 x10E3/uL (ref 1.4–7.0)
Neutrophils: 53 %
Platelets: 243 x10E3/uL (ref 150–450)
RBC: 3.82 x10E6/uL (ref 3.77–5.28)
RDW: 11.8 % (ref 11.7–15.4)
WBC: 6 x10E3/uL (ref 3.4–10.8)

## 2024-04-26 LAB — CMP14+EGFR
ALT: 22 IU/L (ref 0–32)
AST: 22 IU/L (ref 0–40)
Albumin: 4.3 g/dL (ref 3.8–4.9)
Alkaline Phosphatase: 67 IU/L (ref 44–121)
BUN/Creatinine Ratio: 16 (ref 12–28)
BUN: 14 mg/dL (ref 8–27)
Bilirubin Total: <0.2 mg/dL (ref 0.0–1.2)
CO2: 23 mmol/L (ref 20–29)
Calcium: 9 mg/dL (ref 8.7–10.3)
Chloride: 104 mmol/L (ref 96–106)
Creatinine, Ser: 0.88 mg/dL (ref 0.57–1.00)
Globulin, Total: 2.6 g/dL (ref 1.5–4.5)
Glucose: 72 mg/dL (ref 70–99)
Potassium: 4.2 mmol/L (ref 3.5–5.2)
Sodium: 140 mmol/L (ref 134–144)
Total Protein: 6.9 g/dL (ref 6.0–8.5)
eGFR: 75 mL/min/1.73 (ref >59)

## 2024-05-01 ENCOUNTER — Encounter: Payer: Self-pay | Admitting: Nurse Practitioner

## 2024-05-02 ENCOUNTER — Other Ambulatory Visit: Payer: Self-pay | Admitting: Nurse Practitioner

## 2024-05-02 DIAGNOSIS — K5790 Diverticulosis of intestine, part unspecified, without perforation or abscess without bleeding: Secondary | ICD-10-CM

## 2024-05-02 DIAGNOSIS — K582 Mixed irritable bowel syndrome: Secondary | ICD-10-CM

## 2024-05-08 ENCOUNTER — Other Ambulatory Visit: Payer: Self-pay | Admitting: Nurse Practitioner

## 2024-05-08 MED ORDER — AMOXICILLIN-POT CLAVULANATE 875-125 MG PO TABS: 1.0000 | ORAL_TABLET | Freq: Two times a day (BID) | ORAL | 0 refills | Status: DC

## 2024-05-10 ENCOUNTER — Other Ambulatory Visit: Payer: Self-pay | Admitting: Nurse Practitioner

## 2024-05-10 ENCOUNTER — Encounter (INDEPENDENT_AMBULATORY_CARE_PROVIDER_SITE_OTHER): Payer: Self-pay | Admitting: *Deleted

## 2024-05-10 MED ORDER — METRONIDAZOLE 500 MG PO TABS
500.0000 mg | ORAL_TABLET | Freq: Three times a day (TID) | ORAL | 0 refills | Status: DC
Start: 2024-05-10 — End: 2024-05-28

## 2024-05-10 MED ORDER — CIPROFLOXACIN HCL 500 MG PO TABS: 500.0000 mg | ORAL_TABLET | Freq: Two times a day (BID) | ORAL | 0 refills | Status: DC

## 2024-05-24 ENCOUNTER — Ambulatory Visit (INDEPENDENT_AMBULATORY_CARE_PROVIDER_SITE_OTHER): Admitting: Gastroenterology

## 2024-05-24 ENCOUNTER — Encounter: Payer: Self-pay | Admitting: *Deleted

## 2024-05-24 ENCOUNTER — Encounter (INDEPENDENT_AMBULATORY_CARE_PROVIDER_SITE_OTHER): Payer: Self-pay | Admitting: Gastroenterology

## 2024-05-24 ENCOUNTER — Other Ambulatory Visit: Payer: Self-pay | Admitting: *Deleted

## 2024-05-24 VITALS — BP 131/83 | HR 77 | Temp 97.5°F | Ht 63.0 in | Wt 141.9 lb

## 2024-05-24 DIAGNOSIS — K921 Melena: Secondary | ICD-10-CM

## 2024-05-24 DIAGNOSIS — K582 Mixed irritable bowel syndrome: Secondary | ICD-10-CM

## 2024-05-24 DIAGNOSIS — R109 Unspecified abdominal pain: Secondary | ICD-10-CM | POA: Diagnosis not present

## 2024-05-24 DIAGNOSIS — K5900 Constipation, unspecified: Secondary | ICD-10-CM

## 2024-05-24 DIAGNOSIS — R1032 Left lower quadrant pain: Secondary | ICD-10-CM

## 2024-05-24 MED ORDER — PEG 3350-KCL-NA BICARB-NACL 420 G PO SOLR: 4000.0000 mL | Freq: Once | ORAL | 0 refills | Status: AC

## 2024-05-24 MED ORDER — OMEPRAZOLE 20 MG PO CPDR: 20.0000 mg | DELAYED_RELEASE_CAPSULE | Freq: Every day | ORAL | 3 refills | Status: DC
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
## 2024-05-24 NOTE — Patient Instructions (Addendum)
 Perform blood workup Explained presumed etiology of IBS symptoms. Patient was counseled about the benefit of implementing a low FODMAP to improve symptoms and recurrent episodes. A dietary list was provided to the patient. Also, the patient was counseled about the benefit of avoiding stressing situations and potential environmental triggers leading to symptomatology. Check celiac serology Start IBGard 1 tablet every 8-12 hours as needed for bloating . Can use peppermint tea as well. Start omeprazole  20 mg qday Minimize use of NSAIDs like meloxicam , Aleve or ibuprofen Schedule colonoscopy
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
## 2024-05-25 ENCOUNTER — Encounter: Payer: Self-pay | Admitting: *Deleted

## 2024-05-25 ENCOUNTER — Ambulatory Visit: Payer: Self-pay | Admitting: Gastroenterology

## 2024-05-25 LAB — CELIAC DISEASE PANEL
(tTG) Ab, IgA: <1 U/mL
(tTG) Ab, IgG: <1 U/mL
Gliadin IgA: <1 U/mL
Gliadin IgG: 4.4 U/mL
Immunoglobulin A: 300 mg/dL (ref 47–310)

## 2024-05-27 ENCOUNTER — Telehealth: Admitting: Physician Assistant

## 2024-05-27 DIAGNOSIS — B9789 Other viral agents as the cause of diseases classified elsewhere: Secondary | ICD-10-CM | POA: Diagnosis not present

## 2024-05-27 DIAGNOSIS — J019 Acute sinusitis, unspecified: Secondary | ICD-10-CM

## 2024-05-28 ENCOUNTER — Other Ambulatory Visit: Payer: Self-pay

## 2024-05-28 ENCOUNTER — Telehealth: Payer: Self-pay

## 2024-05-28 MED ORDER — FLUTICASONE PROPIONATE 50 MCG/ACT NA SUSP: 2.0000 | Freq: Every day | NASAL | 0 refills | Status: DC

## 2024-05-28 MED ORDER — MELOXICAM 15 MG PO TABS: 15.0000 mg | ORAL_TABLET | Freq: Every day | ORAL | 0 refills | Status: DC

## 2024-05-28 NOTE — Telephone Encounter (Signed)
 Prescription Request  05/28/2024  LOV: Visit date not found  What is the name of the medication or equipment?    Have you contacted your pharmacy to request a refill? Yes   Which pharmacy would you like this sent to?  [facility] [phone] - [place], [place] - [address] AT [facility] [address] [place] [address] Phone: [phone] Fax: [phone]    Patient notified that their request is being sent to the clinical staff for review and that they should receive a response within 2 business days.   Please advise at Mobile [phone] (mobile)

## 2024-05-28 NOTE — Telephone Encounter (Signed)
 Prescription Request  05/28/2024  LOV: Visit date not found  What is the name of the medication or equipment? meloxicam  (MOBIC ) 15 MG tablet   Have you contacted your pharmacy to request a refill? Yes   Which pharmacy would you like this sent to?  [facility] [phone] - [place], [place] - [address] AT [facility] [address]  [place] [address] Phone: [phone] Fax: [phone]    Patient notified that their request is being sent to the clinical staff for review and that they should receive a response within 2 business days.   Please advise at Mobile [phone] (mobile)

## 2024-05-28 NOTE — Progress Notes (Signed)
 E-Visit for Sinus Problems  We are sorry that you are not feeling well.  Here is how we plan to help!  Based on what you have shared with me it looks like you have sinusitis.  Sinusitis is inflammation and infection in the sinus cavities of the head.  Based on your presentation I believe you most likely have Acute Viral Sinusitis.This is an infection most likely caused by a virus. There is not specific treatment for viral sinusitis other than to help you with the symptoms until the infection runs its course.  You may use an oral decongestant such as Mucinex D or if you have glaucoma or high blood pressure use plain Mucinex. Saline nasal spray help and can safely be used as often as needed for congestion, I have prescribed: Fluticasone  nasal spray two sprays in each nostril once a day  Some authorities believe that zinc sprays or the use of Echinacea may shorten the course of your symptoms.  Sinus infections are not as easily transmitted as other respiratory infection, however we still recommend that you avoid close contact with loved ones, especially the very young and elderly.  Remember to wash your hands thoroughly throughout the day as this is the number one way to prevent the spread of infection!  I also want to mention your symptoms could be consistent with Covid 19 and cases have been on the rise in [place]. If desired, you could take an at home Covid 19 test. If positive you can follow back up and let us  know so we can change treatment for you.   Home Care: Only take medications as instructed by your medical team. Do not take these medications with alcohol. A steam or ultrasonic humidifier can help congestion.  You can place a towel over your head and breathe in the steam from hot water coming from a faucet. Avoid close contacts especially the very young and the elderly. Cover your mouth when you cough or sneeze. Always remember to wash your hands.  Get Help Right Away If: You develop  worsening fever or sinus pain. You develop a severe head ache or visual changes. Your symptoms persist after you have completed your treatment plan.  Make sure you Understand these instructions. Will watch your condition. Will get help right away if you are not doing well or get worse.   Thank you for choosing an e-visit.  Your e-visit answers were reviewed by a board certified advanced clinical practitioner to complete your personal care plan. Depending upon the condition, your plan could have included both over the counter or prescription medications.  Please review your pharmacy choice. Make sure the pharmacy is open so you can pick up prescription now. If there is a problem, you may contact your provider through [employer] and have the prescription routed to another pharmacy.  Your safety is important to us . If you have drug allergies check your prescription carefully.   For the next 24 hours you can use MyChart to ask questions about today's visit, request a non-urgent call back, or ask for a work or school excuse. You will get an email in the next two days asking about your experience. I hope that your e-visit has been valuable and will speed your recovery.    I have spent 5 minutes in review of e-visit questionnaire, review and updating patient chart, medical decision making and response to patient.   Delon CHRISTELLA Dickinson, PA-C

## 2024-05-31 DIAGNOSIS — M9903 Segmental and somatic dysfunction of lumbar region: Secondary | ICD-10-CM | POA: Diagnosis not present

## 2024-05-31 DIAGNOSIS — M9901 Segmental and somatic dysfunction of cervical region: Secondary | ICD-10-CM | POA: Diagnosis not present

## 2024-05-31 DIAGNOSIS — M9905 Segmental and somatic dysfunction of pelvic region: Secondary | ICD-10-CM | POA: Diagnosis not present

## 2024-05-31 DIAGNOSIS — M9902 Segmental and somatic dysfunction of thoracic region: Secondary | ICD-10-CM | POA: Diagnosis not present

## 2024-06-04 ENCOUNTER — Encounter: Payer: Self-pay | Admitting: *Deleted

## 2024-06-04 MED ORDER — NA SULFATE-K SULFATE-MG SULF 17.5-3.13-1.6 GM/177ML PO SOLN: ORAL | 0 refills | Status: DC

## 2024-06-04 NOTE — Addendum Note (Signed)
 Addended by: GAYLENE MADELIN CROME on: 06/04/2024 04:04 PM   Modules accepted: Orders

## 2024-06-04 NOTE — Telephone Encounter (Signed)
 Hey Tammy, Do you know anything about this? Is it possible to send in a lower volume prep?

## 2024-06-05 ENCOUNTER — Other Ambulatory Visit: Payer: Self-pay | Admitting: Nurse Practitioner

## 2024-06-05 MED ORDER — PREDNISONE 20 MG PO TABS: ORAL_TABLET | ORAL | 0 refills | Status: DC

## 2024-06-06 DIAGNOSIS — M9902 Segmental and somatic dysfunction of thoracic region: Secondary | ICD-10-CM | POA: Diagnosis not present

## 2024-06-06 DIAGNOSIS — M9901 Segmental and somatic dysfunction of cervical region: Secondary | ICD-10-CM | POA: Diagnosis not present

## 2024-06-06 DIAGNOSIS — M9903 Segmental and somatic dysfunction of lumbar region: Secondary | ICD-10-CM | POA: Diagnosis not present

## 2024-06-06 DIAGNOSIS — M9905 Segmental and somatic dysfunction of pelvic region: Secondary | ICD-10-CM | POA: Diagnosis not present

## 2024-06-08 ENCOUNTER — Encounter (HOSPITAL_COMMUNITY)
Admission: RE | Admit: 2024-06-08 | Discharge: 2024-06-08 | Disposition: A | Discharge status: Home / Self Care | Source: Ambulatory Visit | Attending: Gastroenterology | Admitting: Gastroenterology

## 2024-06-12 ENCOUNTER — Ambulatory Visit (HOSPITAL_COMMUNITY)
Admission: RE | Admit: 2024-06-12 | Discharge: 2024-06-12 | Disposition: A | Discharge status: Home / Self Care | Attending: Gastroenterology | Admitting: Gastroenterology

## 2024-06-12 ENCOUNTER — Encounter (HOSPITAL_COMMUNITY): Payer: Self-pay | Admitting: Gastroenterology

## 2024-06-12 ENCOUNTER — Encounter (INDEPENDENT_AMBULATORY_CARE_PROVIDER_SITE_OTHER): Payer: Self-pay | Admitting: *Deleted

## 2024-06-12 ENCOUNTER — Ambulatory Visit (HOSPITAL_COMMUNITY): Admitting: Anesthesiology

## 2024-06-12 ENCOUNTER — Encounter (HOSPITAL_COMMUNITY)
Admission: RE | Disposition: A | Discharge status: Home / Self Care | Payer: Self-pay | Source: Home / Self Care | Attending: Gastroenterology

## 2024-06-12 DIAGNOSIS — I10 Essential (primary) hypertension: Secondary | ICD-10-CM | POA: Diagnosis not present

## 2024-06-12 DIAGNOSIS — R194 Change in bowel habit: Secondary | ICD-10-CM | POA: Insufficient documentation

## 2024-06-12 DIAGNOSIS — K5730 Diverticulosis of large intestine without perforation or abscess without bleeding: Secondary | ICD-10-CM | POA: Insufficient documentation

## 2024-06-12 DIAGNOSIS — K635 Polyp of colon: Secondary | ICD-10-CM | POA: Diagnosis not present

## 2024-06-12 DIAGNOSIS — D122 Benign neoplasm of ascending colon: Secondary | ICD-10-CM | POA: Insufficient documentation

## 2024-06-12 DIAGNOSIS — K648 Other hemorrhoids: Secondary | ICD-10-CM | POA: Diagnosis not present

## 2024-06-12 LAB — HM COLONOSCOPY

## 2024-06-12 SURGERY — COLONOSCOPY
Anesthesia: General

## 2024-06-12 MED ORDER — LACTATED RINGERS IV SOLN: INTRAVENOUS | Status: DC | PRN

## 2024-06-12 MED ORDER — LACTATED RINGERS IV SOLN: INTRAVENOUS | Status: DC

## 2024-06-12 MED ORDER — PROPOFOL 500 MG/50ML IV EMUL
INTRAVENOUS | Status: DC | PRN
  Administered 2024-06-12: 125 ug/kg/min via INTRAVENOUS

## 2024-06-12 MED ORDER — PROPOFOL 10 MG/ML IV BOLUS
INTRAVENOUS | Status: DC | PRN
  Administered 2024-06-12 (×2): 50 mg via INTRAVENOUS

## 2024-06-12 NOTE — Anesthesia Preprocedure Evaluation (Signed)
 Anesthesia Evaluation  Patient identified by MRN, date of birth, ID band Patient awake    Reviewed: Allergy & Precautions, H&P , NPO status , Patient's Chart, lab work & pertinent test results, reviewed documented beta blocker date and time   Airway Mallampati: II  TM Distance: >3 FB Neck ROM: full    Dental no notable dental hx.    Pulmonary neg pulmonary ROS   Pulmonary exam normal breath sounds clear to auscultation       Cardiovascular Exercise Tolerance: Good hypertension, negative cardio ROS  Rhythm:regular Rate:Normal     Neuro/Psych negative neurological ROS  negative psych ROS   GI/Hepatic negative GI ROS, Neg liver ROS,GERD  ,,  Endo/Other  negative endocrine ROS    Renal/GU negative Renal ROS  negative genitourinary   Musculoskeletal   Abdominal   Peds  Hematology negative hematology ROS (+)   Anesthesia Other Findings   Reproductive/Obstetrics negative OB ROS                             Anesthesia Physical Anesthesia Plan  ASA: 2  Anesthesia Plan: General   Post-op Pain Management:    Induction:   PONV Risk Score and Plan: Propofol infusion  Airway Management Planned:   Additional Equipment:   Intra-op Plan:   Post-operative Plan:   Informed Consent: I have reviewed the patients History and Physical, chart, labs and discussed the procedure including the risks, benefits and alternatives for the proposed anesthesia with the patient or authorized representative who has indicated his/her understanding and acceptance.     Dental Advisory Given  Plan Discussed with: CRNA  Anesthesia Plan Comments:        Anesthesia Quick Evaluation

## 2024-06-12 NOTE — Transfer of Care (Signed)
 Immediate Anesthesia Transfer of Care Note  Patient: Darlene Galvan  Procedure(s) Performed: COLONOSCOPY  Patient Location: PACU  Anesthesia Type:MAC  Level of Consciousness: awake and alert   Airway & Oxygen Therapy: Patient Spontanous Breathing and Patient connected to nasal cannula oxygen  Post-op Assessment: Report given to RN and Post -op Vital signs reviewed and stable  Post vital signs: Reviewed and stable  Last Vitals:  Vitals Value Taken Time  BP 115/76 06/12/24 14:21  Temp 36.6 C 06/12/24 14:21  Pulse 79 06/12/24 14:21  Resp 15 06/12/24 14:21  SpO2 99 % 06/12/24 14:21    Last Pain:  Vitals:   06/12/24 1421  TempSrc: Axillary  PainSc: 0-No pain         Complications: No notable events documented.

## 2024-06-12 NOTE — Discharge Instructions (Signed)
 You are being discharged to home.  Resume your previous diet.  We are waiting for your pathology results.  Your physician has recommended a repeat colonoscopy for surveillance based on pathology results.

## 2024-06-12 NOTE — Interval H&P Note (Signed)
 History and Physical Interval Note:  06/12/2024 1:21 PM  Darlene Galvan  has presented today for surgery, with the diagnosis of LLQ abdominal pain.  The various methods of treatment have been discussed with the patient and family. After consideration of risks, benefits and other options for treatment, the patient has consented to  Procedure(s) with comments: COLONOSCOPY (N/A) - 2:00 pm, asa 1-2 as a surgical intervention.  The patient's history has been reviewed, patient examined, no change in status, stable for surgery.  I have reviewed the patient's chart and labs.  Questions were answered to the patient's satisfaction.     Tamika Shropshire Castaneda Mayorga

## 2024-06-12 NOTE — Op Note (Signed)
 [facility] Patient Name: Darlene Galvan Procedure Date: 06/12/2024 1:50 PM MRN: [id number] Date of Birth: 1965-10-31 Attending MD: Toribio Fortune , , [id number] CSN: [id number] Age: 58 Admit Type: Outpatient Procedure:                Colonoscopy Indications:              Change in bowel habits Providers:                Toribio Fortune, Rosina Sprague, Daphne Mulch                            Technician, Technician Referring MD:              Medicines:                Monitored Anesthesia Care Complications:            No immediate complications. Estimated Blood Loss:     Estimated blood loss: none. Procedure:                Pre-Anesthesia Assessment:                           - Prior to the procedure, a History and Physical                            was performed, and patient medications, allergies                            and sensitivities were reviewed. The patient's                            tolerance of previous anesthesia was reviewed.                           - The risks and benefits of the procedure and the                            sedation options and risks were discussed with the                            patient. All questions were answered and informed                            consent was obtained.                           - ASA Grade Assessment: II - A patient with mild                            systemic disease.                           After obtaining informed consent, the colonoscope                            was passed under direct vision. Throughout the  procedure, the patient's blood pressure, pulse, and                            oxygen saturations were monitored continuously. The                            [id number] ([id number]) was introduced through the                            anus and advanced to the the cecum, identified by                            appendiceal orifice and ileocecal valve. The                             colonoscopy was performed without difficulty. The                            patient tolerated the procedure well. The quality                            of the bowel preparation was good. Scope In: 2:04:34 PM Scope Out: 2:17:28 PM Scope Withdrawal Time: 0 hours 10 minutes 40 seconds  Total Procedure Duration: 0 hours 12 minutes 54 seconds  Findings:      The perianal and digital rectal examinations were normal.      A 5 mm polyp was found in the ascending colon. The polyp was sessile.       The polyp was removed with a cold snare. Resection and retrieval were       complete.      Scattered small-mouthed diverticula were found in the sigmoid colon and       descending colon.      Non-bleeding internal hemorrhoids were found during retroflexion. The       hemorrhoids were small. Impression:               - One 5 mm polyp in the ascending colon, removed                            with a cold snare. Resected and retrieved.                           - Diverticulosis in the sigmoid colon and in the                            descending colon.                           - Non-bleeding internal hemorrhoids. Moderate Sedation:      Per Anesthesia Care Recommendation:           - Discharge patient to home (ambulatory).                           - Resume previous diet.                           -  Await pathology results.                           - Repeat colonoscopy for surveillance based on                            pathology results. Procedure Code(s):        --- Professional ---                           [phone], Colonoscopy, flexible; with removal of                            tumor(s), polyp(s), or other lesion(s) by snare                            technique Diagnosis Code(s):        --- Professional ---                           D12.2, Benign neoplasm of ascending colon                           K64.8, Other hemorrhoids                           R19.4, Change in bowel habit                            K57.30, Diverticulosis of large intestine without                            perforation or abscess without bleeding CPT copyright 2022 American Medical Association. All rights reserved. The codes documented in this report are preliminary and upon coder review may  be revised to meet current compliance requirements. Toribio Fortune, MD Toribio Fortune,  06/12/2024 2:24:03 PM This report has been signed electronically. Number of Addenda: 0

## 2024-06-13 DIAGNOSIS — M9901 Segmental and somatic dysfunction of cervical region: Secondary | ICD-10-CM | POA: Diagnosis not present

## 2024-06-13 DIAGNOSIS — M9903 Segmental and somatic dysfunction of lumbar region: Secondary | ICD-10-CM | POA: Diagnosis not present

## 2024-06-13 DIAGNOSIS — M9902 Segmental and somatic dysfunction of thoracic region: Secondary | ICD-10-CM | POA: Diagnosis not present

## 2024-06-13 DIAGNOSIS — M9905 Segmental and somatic dysfunction of pelvic region: Secondary | ICD-10-CM | POA: Diagnosis not present

## 2024-06-14 ENCOUNTER — Encounter (HOSPITAL_COMMUNITY): Payer: Self-pay | Admitting: Gastroenterology

## 2024-06-14 ENCOUNTER — Ambulatory Visit: Payer: Self-pay | Admitting: Gastroenterology

## 2024-06-14 LAB — SURGICAL PATHOLOGY

## 2024-06-16 NOTE — Anesthesia Postprocedure Evaluation (Signed)
 Anesthesia Post Note  Patient: Darlene Galvan  Procedure(s) Performed: COLONOSCOPY  Patient location during evaluation: Phase II Anesthesia Type: General Level of consciousness: awake Pain management: pain level controlled Vital Signs Assessment: post-procedure vital signs reviewed and stable Respiratory status: spontaneous breathing and respiratory function stable Cardiovascular status: blood pressure returned to baseline and stable Postop Assessment: no headache and no apparent nausea or vomiting Anesthetic complications: no Comments: Late entry   No notable events documented.   Last Vitals:  Vitals:   06/12/24 1245 06/12/24 1421  BP: (!) 143/76 115/76  Pulse:  79  Resp: 12 15  Temp: 36.8 C 36.6 C  SpO2: 97% 99%    Last Pain:  Vitals:   06/13/24 1519  TempSrc:   PainSc: 0-No pain                 Darlene Galvan

## 2024-06-25 ENCOUNTER — Encounter (INDEPENDENT_AMBULATORY_CARE_PROVIDER_SITE_OTHER): Payer: Self-pay | Admitting: *Deleted

## 2024-06-25 NOTE — Progress Notes (Signed)
 7 yr TCS noted in recall Patient result letter mailed procedure note and pathology result faxed to PCP

## 2024-06-27 DIAGNOSIS — M9901 Segmental and somatic dysfunction of cervical region: Secondary | ICD-10-CM | POA: Diagnosis not present

## 2024-06-27 DIAGNOSIS — M9902 Segmental and somatic dysfunction of thoracic region: Secondary | ICD-10-CM | POA: Diagnosis not present

## 2024-06-27 DIAGNOSIS — M9905 Segmental and somatic dysfunction of pelvic region: Secondary | ICD-10-CM | POA: Diagnosis not present

## 2024-06-27 DIAGNOSIS — M9903 Segmental and somatic dysfunction of lumbar region: Secondary | ICD-10-CM | POA: Diagnosis not present

## 2024-08-01 ENCOUNTER — Encounter (INDEPENDENT_AMBULATORY_CARE_PROVIDER_SITE_OTHER): Payer: Self-pay | Admitting: Gastroenterology

## 2024-08-09 DIAGNOSIS — L821 Other seborrheic keratosis: Secondary | ICD-10-CM | POA: Diagnosis not present

## 2024-08-09 DIAGNOSIS — L578 Other skin changes due to chronic exposure to nonionizing radiation: Secondary | ICD-10-CM | POA: Diagnosis not present

## 2024-08-09 DIAGNOSIS — L570 Actinic keratosis: Secondary | ICD-10-CM | POA: Diagnosis not present

## 2024-08-09 DIAGNOSIS — L814 Other melanin hyperpigmentation: Secondary | ICD-10-CM | POA: Diagnosis not present

## 2024-08-09 DIAGNOSIS — L2089 Other atopic dermatitis: Secondary | ICD-10-CM | POA: Diagnosis not present

## 2024-08-29 DIAGNOSIS — M9901 Segmental and somatic dysfunction of cervical region: Secondary | ICD-10-CM | POA: Diagnosis not present

## 2024-08-29 DIAGNOSIS — M9905 Segmental and somatic dysfunction of pelvic region: Secondary | ICD-10-CM | POA: Diagnosis not present

## 2024-08-29 DIAGNOSIS — M9903 Segmental and somatic dysfunction of lumbar region: Secondary | ICD-10-CM | POA: Diagnosis not present

## 2024-08-29 DIAGNOSIS — M9902 Segmental and somatic dysfunction of thoracic region: Secondary | ICD-10-CM | POA: Diagnosis not present

## 2024-09-03 ENCOUNTER — Ambulatory Visit (INDEPENDENT_AMBULATORY_CARE_PROVIDER_SITE_OTHER): Admitting: Gastroenterology

## 2024-09-03 DIAGNOSIS — M9902 Segmental and somatic dysfunction of thoracic region: Secondary | ICD-10-CM | POA: Diagnosis not present

## 2024-09-03 DIAGNOSIS — M9905 Segmental and somatic dysfunction of pelvic region: Secondary | ICD-10-CM | POA: Diagnosis not present

## 2024-09-03 DIAGNOSIS — M9903 Segmental and somatic dysfunction of lumbar region: Secondary | ICD-10-CM | POA: Diagnosis not present

## 2024-09-03 DIAGNOSIS — M9901 Segmental and somatic dysfunction of cervical region: Secondary | ICD-10-CM | POA: Diagnosis not present

## 2024-09-10 DIAGNOSIS — M9902 Segmental and somatic dysfunction of thoracic region: Secondary | ICD-10-CM | POA: Diagnosis not present

## 2024-09-10 DIAGNOSIS — M7912 Myalgia of auxiliary muscles, head and neck: Secondary | ICD-10-CM | POA: Diagnosis not present

## 2024-09-10 DIAGNOSIS — M9905 Segmental and somatic dysfunction of pelvic region: Secondary | ICD-10-CM | POA: Diagnosis not present

## 2024-09-10 DIAGNOSIS — M9903 Segmental and somatic dysfunction of lumbar region: Secondary | ICD-10-CM | POA: Diagnosis not present

## 2024-09-10 DIAGNOSIS — M542 Cervicalgia: Secondary | ICD-10-CM | POA: Diagnosis not present

## 2024-09-10 DIAGNOSIS — M9901 Segmental and somatic dysfunction of cervical region: Secondary | ICD-10-CM | POA: Diagnosis not present

## 2024-09-17 DIAGNOSIS — M9903 Segmental and somatic dysfunction of lumbar region: Secondary | ICD-10-CM | POA: Diagnosis not present

## 2024-09-17 DIAGNOSIS — M9902 Segmental and somatic dysfunction of thoracic region: Secondary | ICD-10-CM | POA: Diagnosis not present

## 2024-09-17 DIAGNOSIS — M9905 Segmental and somatic dysfunction of pelvic region: Secondary | ICD-10-CM | POA: Diagnosis not present

## 2024-09-17 DIAGNOSIS — M9901 Segmental and somatic dysfunction of cervical region: Secondary | ICD-10-CM | POA: Diagnosis not present

## 2024-10-01 DIAGNOSIS — M9901 Segmental and somatic dysfunction of cervical region: Secondary | ICD-10-CM | POA: Diagnosis not present

## 2024-10-01 DIAGNOSIS — M9903 Segmental and somatic dysfunction of lumbar region: Secondary | ICD-10-CM | POA: Diagnosis not present

## 2024-10-01 DIAGNOSIS — M9902 Segmental and somatic dysfunction of thoracic region: Secondary | ICD-10-CM | POA: Diagnosis not present

## 2024-10-01 DIAGNOSIS — M9905 Segmental and somatic dysfunction of pelvic region: Secondary | ICD-10-CM | POA: Diagnosis not present

## 2024-10-02 DIAGNOSIS — M7912 Myalgia of auxiliary muscles, head and neck: Secondary | ICD-10-CM | POA: Diagnosis not present

## 2024-10-02 DIAGNOSIS — M542 Cervicalgia: Secondary | ICD-10-CM | POA: Diagnosis not present

## 2024-10-08 ENCOUNTER — Encounter

## 2024-10-16 DIAGNOSIS — M542 Cervicalgia: Secondary | ICD-10-CM | POA: Diagnosis not present

## 2024-11-09 ENCOUNTER — Ambulatory Visit: Payer: Self-pay

## 2024-11-09 ENCOUNTER — Ambulatory Visit: Admitting: Physician Assistant

## 2024-11-09 VITALS — BP 132/75 | HR 80 | Temp 98.3°F | Ht 63.0 in | Wt 143.0 lb

## 2024-11-09 DIAGNOSIS — J0100 Acute maxillary sinusitis, unspecified: Secondary | ICD-10-CM

## 2024-11-09 MED ORDER — FLUTICASONE PROPIONATE 50 MCG/ACT NA SUSP: 2.0000 | Freq: Every day | NASAL | 6 refills | Status: AC

## 2024-11-09 MED ORDER — AMOXICILLIN-POT CLAVULANATE 875-125 MG PO TABS: 1.0000 | ORAL_TABLET | Freq: Two times a day (BID) | ORAL | 0 refills | Status: AC

## 2024-11-09 NOTE — Assessment & Plan Note (Signed)
 Presentation was consistent with sinusitis.  No evidence of other bacterial infections including pneumonia, pharyngitis, or orbital cellulitis. Discussed that this fits the picture of viral vs bacterial sinusitis and that due to type and duration of symptoms and exam findings, we will treat as bacterial sinusitis.  Antibiotics prescribed. Advised to continue ibuprofen and Tylenol  at home. The patient was instructed to return if the worsens in any way, especially if not tolerating fluids, increased sinus pain or swelling, worsening headache, persistent fever, difficulty swallowing or breathing, or as needed. The patient agreed with the plan.

## 2024-11-09 NOTE — Telephone Encounter (Signed)
 FYI Only or Action Required?: FYI only for provider: appointment scheduled on 1/23.  Patient was last seen in primary care on 04/25/2024 by Grooms, [place], [place].  Called Nurse Triage reporting Sinusitis.  Symptoms began 3 days ago.  Interventions attempted: OTC medications: ibuprofen, nasal washees.  Symptoms are: gradually worsening.  Triage Disposition: No disposition on file.  Patient/caregiver understands and will follow disposition?: Yes   Summary: productive cough   Reason for Triage: 3 days sinus infection .. throbbing headache, productive cough with discolored mucus and sore throat         Answer Assessment - Initial Assessment Questions 1. LOCATION: Where does it hurt?      Forehead  2. ONSET: When did the sinus pain start?  (e.g., hours, days)      3 days ago 3. SEVERITY: How bad is the pain?   (Scale 0-10; or none, mild, moderate or severe)     Moderate 4. RECURRENT SYMPTOM: Have you ever had sinus problems before? If Yes, ask: When was the last time? and What happened that time?       5. NASAL CONGESTION: Is the nose blocked? If Yes, ask: Can you open it or must you breathe through your mouth?     yes 6. NASAL DISCHARGE: Do you have discharge from your nose? If so ask, What color?     green 7. FEVER: Do you have a fever? If Yes, ask: What is it, how was it measured, and when did it start?      denies 8. OTHER SYMPTOMS: Do you have any other symptoms? (e.g., sore throat, cough, earache, difficulty breathing)     Sore throat 9. PREGNANCY: Is there any chance you are pregnant? When was your last menstrual period?  Protocols used: Sinus Pain or Congestion-A-AH

## 2024-11-09 NOTE — Progress Notes (Signed)
 "  Acute Office Visit  Subjective:     Patient ID: Darlene Galvan, female    DOB: Oct 26, 1965, 59 y.o.   MRN: [id number]   Discussed the use of AI scribe software for clinical note transcription with the patient, who gave verbal consent to proceed.  History of Present Illness Darlene Galvan is a 59 year old female who presents with sinus congestion and headache.  She has been experiencing symptoms since Monday night, starting with weakness and tiredness. From Tuesday onwards, she has been waking up with a severe headache across her forehead, along with puffiness and swelling in her sinuses, which are also sore. Two days ago, she developed a sore throat, and her postnasal drainage has changed to a yellowish-green color. No fever is reported. Her husband was recently treated for a sinus infection and cough. She is currently taking Sudafed, Tylenol  Sinus, and ibuprofen for her symptoms. She is allergic to sulfa antibiotics.     Review of Systems  Constitutional:  Positive for activity change and fatigue. Negative for appetite change and fever.  HENT:  Positive for congestion, postnasal drip, sinus pressure, sinus pain and sore throat.   Respiratory:  Negative for cough.   Cardiovascular:  Negative for chest pain.  Neurological:  Positive for headaches. Negative for light-headedness.         Objective:     BP 132/75   Pulse 80   Temp 98.3 F (36.8 C)   Ht 5' 3 (1.6 m)   Wt 143 lb (64.9 kg)   SpO2 96%   BMI 25.33 kg/m   Physical Exam Vitals reviewed.  Constitutional:      General: She is not in acute distress.    Appearance: Normal appearance. She is ill-appearing.  HENT:     Nose: Congestion and rhinorrhea present.     Right Sinus: Maxillary sinus tenderness and frontal sinus tenderness present.     Left Sinus: Maxillary sinus tenderness and frontal sinus tenderness present.     Mouth/Throat:     Mouth: Mucous membranes are moist.     Pharynx: Oropharynx is clear.  Posterior oropharyngeal erythema present.  Eyes:     Extraocular Movements: Extraocular movements intact.     Conjunctiva/sclera: Conjunctivae normal.  Cardiovascular:     Rate and Rhythm: Normal rate and regular rhythm.     Heart sounds: Normal heart sounds. No murmur heard. Pulmonary:     Effort: Pulmonary effort is normal.     Breath sounds: Normal breath sounds. No stridor. No wheezing, rhonchi or rales.  Lymphadenopathy:     Cervical: Cervical adenopathy present.  Skin:    General: Skin is warm and dry.  Neurological:     General: No focal deficit present.     Mental Status: She is alert and oriented to person, place, and time.  Psychiatric:        Mood and Affect: Mood normal.        Behavior: Behavior normal.     No results found for any visits on 11/09/24.      Assessment & Plan:  Acute non-recurrent maxillary sinusitis Assessment & Plan: Presentation was consistent with sinusitis.  No evidence of other bacterial infections including pneumonia, pharyngitis, or orbital cellulitis. Discussed that this fits the picture of viral vs bacterial sinusitis and that due to type and duration of symptoms and exam findings, we will treat as bacterial sinusitis.  Antibiotics prescribed. Advised to continue ibuprofen and Tylenol  at home. The patient was instructed  to return if the worsens in any way, especially if not tolerating fluids, increased sinus pain or swelling, worsening headache, persistent fever, difficulty swallowing or breathing, or as needed. The patient agreed with the plan.   Orders: -     Amoxicillin -Pot Clavulanate; Take 1 tablet by mouth 2 (two) times daily for 7 days.  Dispense: 14 tablet; Refill: 0 -     Fluticasone  Propionate; Place 2 sprays into both nostrils daily.  Dispense: 16 g; Refill: 6    Return if symptoms worsen or fail to improve.  Tiffane Sheldon, PA-C  "
